# Patient Record
Sex: Male | Born: 1995
Health system: Southern US, Community
[De-identification: ages and names within clinical notes are randomized; demographics above are authoritative.]

## PROBLEM LIST (undated history)

## (undated) DIAGNOSIS — K219 Gastro-esophageal reflux disease without esophagitis: Secondary | ICD-10-CM

## (undated) HISTORY — DX: Gastro-esophageal reflux disease without esophagitis: K21.9

---
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
## 2015-02-01 ENCOUNTER — Ambulatory Visit (INDEPENDENT_AMBULATORY_CARE_PROVIDER_SITE_OTHER): Payer: 59 | Admitting: Emergency Medicine

## 2015-02-01 VITALS — BP 110/70 | HR 81 | Temp 98.6°F | Resp 16 | Ht 70.0 in | Wt 184.0 lb

## 2015-02-01 DIAGNOSIS — J0300 Acute streptococcal tonsillitis, unspecified: Secondary | ICD-10-CM

## 2015-02-01 DIAGNOSIS — J209 Acute bronchitis, unspecified: Secondary | ICD-10-CM | POA: Diagnosis not present

## 2015-02-01 DIAGNOSIS — J0140 Acute pansinusitis, unspecified: Secondary | ICD-10-CM

## 2015-02-01 MED ORDER — PSEUDOEPHEDRINE-GUAIFENESIN ER 60-600 MG PO TB12: 1.0000 | ORAL_TABLET | Freq: Two times a day (BID) | ORAL | Status: DC

## 2015-02-01 MED ORDER — AMOXICILLIN-POT CLAVULANATE 875-125 MG PO TABS: 1.0000 | ORAL_TABLET | Freq: Two times a day (BID) | ORAL | Status: DC

## 2015-02-01 MED ORDER — HYDROCODONE-ACETAMINOPHEN 5-325 MG PO TABS: 1.0000 | ORAL_TABLET | ORAL | Status: DC | PRN

## 2015-02-01 NOTE — Patient Instructions (Signed)

## 2015-02-01 NOTE — Progress Notes (Signed)
Subjective:  Patient ID: Gerald Dickson, male    DOB: 03-27-1995  Age: 19 y.o. MRN: [id number]  CC: Sore Throat; Fever; Nasal Congestion; and Generalized Body Aches   HPI Gerald Dickson presents  he has returned home from college the Thanksgiving holiday he was seen in an urgent care [facility] [place] for a sore throat told that he didn't "have strep" he has fever chills. He has fatigue and decreased appetite. Unable unable to swallow due to pain he has marked for throat with nasal discharge and nasal congestion and a purulent nasal drainage. He has postnasal drip and a nonproductive cough he has no nausea vomiting stool change rash or other complaint he has no improvement with over-the-counter medication  History Gerald Dickson has a past medical history of Asthma.   He has past surgical history that includes Capsulotomy (02/27/2011).   His  family history includes Cancer in his maternal grandfather; Heart attack in his other; Hypertension in his maternal grandmother. There is no history of Diabetes.  He   reports that he has never smoked. He does not have any smokeless tobacco history on file. He reports that he does not drink alcohol. His drug history is not on file.  Outpatient Prescriptions Prior to Visit  Medication Sig Dispense Refill  . ondansetron (ZOFRAN) 4 MG tablet Take 1 tablet (4 mg total) by mouth every 6 (six) hours. 12 tablet 0   No facility-administered medications prior to visit.    Social History   Social History  . Marital Status: Single    Spouse Name: N/A  . Number of Children: N/A  . Years of Education: N/A   Social History Main Topics  . Smoking status: Never Smoker   . Smokeless tobacco: None  . Alcohol Use: No  . Drug Use: None  . Sexual Activity: Not Asked   Other Topics Concern  . None   Social History Narrative     Review of Systems  Constitutional: Positive for fever, chills and fatigue. Negative for appetite change.  HENT: Positive  for congestion, postnasal drip, rhinorrhea, sinus pressure and sore throat. Negative for ear pain.   Eyes: Negative for pain and redness.  Respiratory: Positive for cough. Negative for shortness of breath and wheezing.   Cardiovascular: Negative for leg swelling.  Gastrointestinal: Negative for nausea, vomiting, abdominal pain, diarrhea, constipation and blood in stool.  Endocrine: Negative for polyuria.  Genitourinary: Negative for dysuria, urgency, frequency and flank pain.  Musculoskeletal: Negative for gait problem.  Skin: Negative for rash.  Neurological: Negative for weakness and headaches.  Psychiatric/Behavioral: Negative for confusion and decreased concentration. The patient is not nervous/anxious.     Objective:  BP 110/70 mmHg  Pulse 81  Temp(Src) 98.6 F (37 C) (Oral)  Resp 16  Ht 5\' 10"  (1.778 m)  Wt 184 lb (83.462 kg)  BMI 26.40 kg/m2  SpO2 94%  Physical Exam  Constitutional: He is oriented to person, place, and time. He appears well-developed and well-nourished. No distress.  HENT:  Head: Normocephalic and atraumatic.  Right Ear: External ear normal.  Left Ear: External ear normal.  Nose: Nose normal.  Mouth/Throat: Oropharyngeal exudate and posterior oropharyngeal erythema present.  Eyes: Conjunctivae and EOM are normal. Pupils are equal, round, and reactive to light. No scleral icterus.  Neck: Normal range of motion. Neck supple. No tracheal deviation present.  Cardiovascular: Normal rate, regular rhythm and normal heart sounds.   Pulmonary/Chest: Effort normal. No respiratory distress. He has no wheezes. He  has no rales.  Abdominal: He exhibits no mass. There is no tenderness. There is no rebound and no guarding.  Musculoskeletal: He exhibits no edema.  Lymphadenopathy:    He has no cervical adenopathy.  Neurological: He is alert and oriented to person, place, and time. Coordination normal.  Skin: Skin is warm and dry. No rash noted.  Psychiatric: He has a  normal mood and affect. His behavior is normal.   for the Thanksgiving holiday    Assessment & Plan:   [name] was seen today for sore throat, fever, nasal congestion and generalized body aches.  Diagnoses and all orders for this visit:  Acute non-recurrent streptococcal tonsillitis  Acute bronchitis, unspecified organism  Acute pansinusitis, recurrence not specified  Other orders -     amoxicillin-clavulanate (AUGMENTIN) 875-125 MG tablet; Take 1 tablet by mouth 2 (two) times daily. -     pseudoephedrine-guaifenesin (MUCINEX D) 60-600 MG 12 hr tablet; Take 1 tablet by mouth every 12 (twelve) hours. -     HYDROcodone-acetaminophen (NORCO) 5-325 MG tablet; Take 1-2 tablets by mouth every 4 (four) hours as needed.  I have discontinued [name]'s ondansetron. I am also having him start on amoxicillin-clavulanate, pseudoephedrine-guaifenesin, and HYDROcodone-acetaminophen. Additionally, I am having him maintain his amphetamine-dextroamphetamine.  Meds ordered this encounter  Medications  . amphetamine-dextroamphetamine (ADDERALL) 20 MG tablet    Sig: Take 20 mg by mouth daily.  Marland Kitchen amoxicillin-clavulanate (AUGMENTIN) 875-125 MG tablet    Sig: Take 1 tablet by mouth 2 (two) times daily.    Dispense:  20 tablet    Refill:  0  . pseudoephedrine-guaifenesin (MUCINEX D) 60-600 MG 12 hr tablet    Sig: Take 1 tablet by mouth every 12 (twelve) hours.    Dispense:  18 tablet    Refill:  0  . HYDROcodone-acetaminophen (NORCO) 5-325 MG tablet    Sig: Take 1-2 tablets by mouth every 4 (four) hours as needed.    Dispense:  30 tablet    Refill:  0    Appropriate red flag conditions were discussed with the patient as well as actions that should be taken.  Patient expressed his understanding.  Follow-up: Return if symptoms worsen or fail to improve.  Carmelina Dane, MD

## 2015-03-13 MED FILL — AMPHETAMINE SALTS 20 MG TAB: 20 | 30 days supply | Qty: 60 | Fill #0

## 2015-03-14 ENCOUNTER — Encounter: Payer: Self-pay | Admitting: Family Medicine

## 2015-03-14 ENCOUNTER — Ambulatory Visit (HOSPITAL_BASED_OUTPATIENT_CLINIC_OR_DEPARTMENT_OTHER)
Admission: RE | Admit: 2015-03-14 | Discharge: 2015-03-14 | Disposition: A | Discharge status: Home / Self Care | Payer: 59 | Source: Ambulatory Visit | Attending: Family Medicine | Admitting: Family Medicine

## 2015-03-14 ENCOUNTER — Ambulatory Visit (INDEPENDENT_AMBULATORY_CARE_PROVIDER_SITE_OTHER): Payer: 59 | Admitting: Family Medicine

## 2015-03-14 ENCOUNTER — Other Ambulatory Visit: Payer: Self-pay | Admitting: Family Medicine

## 2015-03-14 VITALS — BP 122/78 | HR 56 | Ht 70.0 in | Wt 190.0 lb

## 2015-03-14 DIAGNOSIS — S8992XA Unspecified injury of left lower leg, initial encounter: Secondary | ICD-10-CM

## 2015-03-14 DIAGNOSIS — M25562 Pain in left knee: Secondary | ICD-10-CM

## 2015-03-14 NOTE — Patient Instructions (Signed)
You have a left knee sprain. Your exam and x-rays are reassuring. Ice the knee 15 minutes at a time 3-4 times a day. Knee extensions, straight leg raises, knee curls, hamstring swings 3 sets of 10 once a day. Do with ankle weights or on the bowflex you have. Check in with the athletic trainers at school when you get there. Ibuprofen or aleve if needed for pain. Consider physical therapy, MRI if not improving as expected. Focus on the rehab for the next 3 weeks then return to all sports as tolerated.

## 2015-03-16 DIAGNOSIS — S8992XA Unspecified injury of left lower leg, initial encounter: Secondary | ICD-10-CM | POA: Insufficient documentation

## 2015-03-16 NOTE — Assessment & Plan Note (Signed)
exam very reassuring and no effusion.  Consistent with a mild sprain.  Independently reviewed radiographs and no evidence abnormalities.  Shown home rehab exercises to do daily.  NSAIDs, icing.  Consider PT, MRI if not improving.  Activities as tolerated.

## 2015-03-16 NOTE — Progress Notes (Signed)
PCP: Carmin RichmondLARK,WILLIAM D, MD  Subjective:   HPI: Patient is a 20 y.o. male here for left knee injury.  Patient reports on 12/17 he was jumping on a trampoline. When he came down one time the knee 'felt weird' and he stopped. Gets pain in knee especially after working out now. Pain level up to 7/10, sharp deep knee anteriorly at worst. No swelling or bruising. No prior injuries. No skin changes, fever, other complaints.  Past Medical History  Diagnosis Date  . Asthma     as child    Current Outpatient Prescriptions on File Prior to Visit  Medication Sig Dispense Refill  . amphetamine-dextroamphetamine (ADDERALL) 20 MG tablet Take 20 mg by mouth daily.    Marland Kitchen. HYDROcodone-acetaminophen (NORCO) 5-325 MG tablet Take 1-2 tablets by mouth every 4 (four) hours as needed. 30 tablet 0   No current facility-administered medications on file prior to visit.    Past Surgical History  Procedure Laterality Date  . Capsulotomy  02/27/2011    Procedure: CAPSULOTOMY;  Surgeon: Nicki ReaperGary R Kuzma, MD;  Location: [facility];  Service: Orthopedics;  Laterality: Left;  Left Capsulotomy Metacarpal Phalangeal Llittle Finger      No Known Allergies  Social History   Social History  . Marital Status: Single    Spouse Name: N/A  . Number of Children: N/A  . Years of Education: N/A   Occupational History  . Not on file.   Social History Main Topics  . Smoking status: Never Smoker   . Smokeless tobacco: Not on file  . Alcohol Use: No  . Drug Use: Not on file  . Sexual Activity: Not on file   Other Topics Concern  . Not on file   Social History Narrative    Family History  Problem Relation Age of Onset  . Hypertension Maternal Grandmother   . Diabetes Neg Hx   . Heart attack Other   . Cancer Maternal Grandfather     BP 122/78 mmHg  Pulse 56  Ht 5\' 10"  (1.778 m)  Wt 190 lb (86.183 kg)  BMI 27.26 kg/m2  Review of Systems: See HPI above.    Objective:  Physical  Exam:  Gen: NAD  Left knee: No gross deformity, ecchymoses, swelling. No TTP. FROM. Negative ant/post drawers. Negative valgus/varus testing. Negative lachmanns. Negative mcmurrays, apleys, patellar apprehension. NV intact distally.  Right knee: FROM without pain.    Assessment & Plan:  1. Left knee injury - exam very reassuring and no effusion.  Consistent with a mild sprain.  Independently reviewed radiographs and no evidence abnormalities.  Shown home rehab exercises to do daily.  NSAIDs, icing.  Consider PT, MRI if not improving.  Activities as tolerated.

## 2015-09-18 MED FILL — DEXTROAMP-AMPHETAMIN 20 MG: 20 | 30 days supply | Qty: 60 | Fill #0

## 2015-09-29 ENCOUNTER — Encounter: Payer: Self-pay | Admitting: Family Medicine

## 2015-09-29 ENCOUNTER — Ambulatory Visit (INDEPENDENT_AMBULATORY_CARE_PROVIDER_SITE_OTHER): Payer: 59 | Admitting: Family Medicine

## 2015-09-29 VITALS — BP 115/75 | HR 49 | Ht 70.0 in | Wt 180.0 lb

## 2015-09-29 DIAGNOSIS — S8991XA Unspecified injury of right lower leg, initial encounter: Secondary | ICD-10-CM | POA: Diagnosis not present

## 2015-09-29 NOTE — Patient Instructions (Signed)
Your exam and ultrasound are reassuring. You have a lateral insertional hamstring tendon strain. Compression sleeve with sports for 4 weeks (wear with walking too if it bothers you with this). Focus on the rehab daily (with trainer and home exercises) - start with curls, swings, lunges. Icing or heat, whichever feels better 15 minutes at a time 3-4 times a day. Aleve 2 tabs twice a day with food OR ibuprofen 600mg  three times a day with food if needed - can consider taking this regularly for 7-10 days then as needed. Nitroglycerin patches 1/4th patch and change daily - consider these but typically only if you've gone 6 weeks with the rehab, compression, anti-inflammatories and are not improving. Activities as tolerated. Call me with any questions or concerns.

## 2015-09-29 NOTE — Assessment & Plan Note (Signed)
Exam, ultrasound reassuring.  No effusion, ligamentous and meniscus testing negative and no joint line tenderness.  Consistent with lateral insertional hamstring tendon strain.  Compression sleeve, focus on rehabilitation (working with Event organiserathletic trainer).  Ice/heat, consider 7-10 days regular nsaids.  Activities as tolerated.  Consider nitro patches, MRI if not improving (unlikely).  Call us with any questions.  Otherwise f/u prn.

## 2015-09-29 NOTE — Progress Notes (Signed)
PCP: Carmin RichmondLARK,WILLIAM D, MD  Subjective:   HPI: Patient is a 20 y.o. male here for right knee injury.  Patient reports on 6/19 he was playing in the outfield (baseball). He was running to catch a ball, tripped over another player's foot and hyperextended his right knee. Immediate pain, localized numbness posterior knee laterally. No swelling of knee. Has improved since then but still feels pain in same area posterolaterally - soreness especially with squatting. Current pain 0/10 though. No prior injuries to this knee (trampoline accident of left knee - seen earlier this year, no problems now). No skin changes, numbness now.  Past Medical History  Diagnosis Date  . Asthma     as child    Current Outpatient Prescriptions on File Prior to Visit  Medication Sig Dispense Refill  . amphetamine-dextroamphetamine (ADDERALL) 20 MG tablet Take 20 mg by mouth daily.    Marland Kitchen. HYDROcodone-acetaminophen (NORCO) 5-325 MG tablet Take 1-2 tablets by mouth every 4 (four) hours as needed. 30 tablet 0   No current facility-administered medications on file prior to visit.    Past Surgical History  Procedure Laterality Date  . Capsulotomy  02/27/2011    Procedure: CAPSULOTOMY;  Surgeon: Nicki ReaperGary R Kuzma, MD;  Location: [facility];  Service: Orthopedics;  Laterality: Left;  Left Capsulotomy Metacarpal Phalangeal Llittle Finger      No Known Allergies  Social History   Social History  . Marital Status: Single    Spouse Name: N/A  . Number of Children: N/A  . Years of Education: N/A   Occupational History  . Not on file.   Social History Main Topics  . Smoking status: Never Smoker   . Smokeless tobacco: Not on file  . Alcohol Use: No  . Drug Use: Not on file  . Sexual Activity: Not on file   Other Topics Concern  . Not on file   Social History Narrative    Family History  Problem Relation Age of Onset  . Hypertension Maternal Grandmother   . Diabetes Neg Hx   . Heart  attack Other   . Cancer Maternal Grandfather     BP 115/75 mmHg  Pulse 49  Ht 5\' 10"  (1.778 m)  Wt 180 lb (81.647 kg)  BMI 25.83 kg/m2  Review of Systems: See HPI above.    Objective:  Physical Exam:  Gen: NAD, comfortable in exam room  Right knee: No gross deformity, ecchymoses, swelling. TTP lateral hamstring tendon at insertion on fibula, fibular head.  Negative tinels here.  No numbness with compression. FROM with mild pain resisted flexion at 90 degrees. Negative ant/post drawers. Negative valgus/varus testing. Negative lachmanns. Negative mcmurrays, apleys, patellar apprehension. NV intact distally.  Left knee: FROM without pain.  MSK u/s: no effusion.  Biceps femoris appears intact without tears, neovascularity, edema.  Fibular head similarly unremarkable.    Assessment & Plan:  1. Right knee injury - Exam, ultrasound reassuring.  No effusion, ligamentous and meniscus testing negative and no joint line tenderness.  Consistent with lateral insertional hamstring tendon strain.  Compression sleeve, focus on rehabilitation (working with Event organiserathletic trainer).  Ice/heat, consider 7-10 days regular nsaids.  Activities as tolerated.  Consider nitro patches, MRI if not improving (unlikely).  Call us with any questions.  Otherwise f/u prn.

## 2015-10-19 MED FILL — DEXTROAMP-AMPHETAMIN 20 MG: 20 | 30 days supply | Qty: 60 | Fill #0

## 2015-10-30 DIAGNOSIS — M25551 Pain in right hip: Secondary | ICD-10-CM | POA: Diagnosis not present

## 2015-10-30 DIAGNOSIS — S76301A Unspecified injury of muscle, fascia and tendon of the posterior muscle group at thigh level, right thigh, initial encounter: Secondary | ICD-10-CM | POA: Insufficient documentation

## 2015-10-30 DIAGNOSIS — M25559 Pain in unspecified hip: Secondary | ICD-10-CM | POA: Insufficient documentation

## 2015-11-03 DIAGNOSIS — S76301A Unspecified injury of muscle, fascia and tendon of the posterior muscle group at thigh level, right thigh, initial encounter: Secondary | ICD-10-CM | POA: Diagnosis not present

## 2015-11-07 DIAGNOSIS — S76301A Unspecified injury of muscle, fascia and tendon of the posterior muscle group at thigh level, right thigh, initial encounter: Secondary | ICD-10-CM | POA: Diagnosis not present

## 2015-11-07 DIAGNOSIS — M25551 Pain in right hip: Secondary | ICD-10-CM | POA: Diagnosis not present

## 2015-11-09 DIAGNOSIS — R6889 Other general symptoms and signs: Secondary | ICD-10-CM | POA: Diagnosis not present

## 2016-01-31 DIAGNOSIS — H52223 Regular astigmatism, bilateral: Secondary | ICD-10-CM | POA: Diagnosis not present

## 2016-01-31 DIAGNOSIS — H5213 Myopia, bilateral: Secondary | ICD-10-CM | POA: Diagnosis not present

## 2016-08-15 MED FILL — AMPHETAMINE SALTS 30 MG TAB: 30 | 30 days supply | Qty: 60 | Fill #0

## 2016-12-03 MED FILL — AMPHETAMINE SALTS 30 MG TAB: 30 | 30 days supply | Qty: 60 | Fill #0

## 2016-12-31 MED FILL — DEXTROAMP-AMPHETAMIN 30 MG: 30 | 30 days supply | Qty: 60 | Fill #0

## 2017-03-14 ENCOUNTER — Other Ambulatory Visit: Payer: Self-pay

## 2017-03-14 ENCOUNTER — Ambulatory Visit (INDEPENDENT_AMBULATORY_CARE_PROVIDER_SITE_OTHER): Payer: Self-pay | Admitting: Emergency Medicine

## 2017-03-14 ENCOUNTER — Ambulatory Visit (HOSPITAL_COMMUNITY)
Admission: EM | Admit: 2017-03-14 | Discharge: 2017-03-14 | Disposition: A | Discharge status: Home / Self Care | Payer: 59 | Attending: Family Medicine | Admitting: Family Medicine

## 2017-03-14 ENCOUNTER — Encounter: Payer: Self-pay | Admitting: Emergency Medicine

## 2017-03-14 ENCOUNTER — Encounter (HOSPITAL_COMMUNITY): Payer: Self-pay | Admitting: Emergency Medicine

## 2017-03-14 VITALS — BP 120/78 | HR 65 | Wt 208.6 lb

## 2017-03-14 DIAGNOSIS — R109 Unspecified abdominal pain: Secondary | ICD-10-CM | POA: Diagnosis not present

## 2017-03-14 DIAGNOSIS — M20029 Boutonniere deformity of unspecified finger(s): Secondary | ICD-10-CM | POA: Diagnosis not present

## 2017-03-14 DIAGNOSIS — Z113 Encounter for screening for infections with a predominantly sexual mode of transmission: Secondary | ICD-10-CM | POA: Diagnosis not present

## 2017-03-14 DIAGNOSIS — Z79891 Long term (current) use of opiate analgesic: Secondary | ICD-10-CM | POA: Insufficient documentation

## 2017-03-14 DIAGNOSIS — R5383 Other fatigue: Secondary | ICD-10-CM | POA: Diagnosis not present

## 2017-03-14 DIAGNOSIS — Z809 Family history of malignant neoplasm, unspecified: Secondary | ICD-10-CM | POA: Insufficient documentation

## 2017-03-14 DIAGNOSIS — Z79899 Other long term (current) drug therapy: Secondary | ICD-10-CM | POA: Diagnosis not present

## 2017-03-14 DIAGNOSIS — J45909 Unspecified asthma, uncomplicated: Secondary | ICD-10-CM | POA: Diagnosis not present

## 2017-03-14 DIAGNOSIS — R300 Dysuria: Secondary | ICD-10-CM

## 2017-03-14 DIAGNOSIS — Z833 Family history of diabetes mellitus: Secondary | ICD-10-CM | POA: Diagnosis not present

## 2017-03-14 DIAGNOSIS — R369 Urethral discharge, unspecified: Secondary | ICD-10-CM

## 2017-03-14 DIAGNOSIS — Z8249 Family history of ischemic heart disease and other diseases of the circulatory system: Secondary | ICD-10-CM | POA: Diagnosis not present

## 2017-03-14 DIAGNOSIS — Z9889 Other specified postprocedural states: Secondary | ICD-10-CM | POA: Diagnosis not present

## 2017-03-14 LAB — POCT URINALYSIS DIP (DEVICE)
BILIRUBIN URINE: NEGATIVE
GLUCOSE, UA: NEGATIVE mg/dL
KETONES UR: NEGATIVE mg/dL
NITRITE: NEGATIVE
PH: 7 (ref 5.0–8.0)
Protein, ur: NEGATIVE mg/dL
Specific Gravity, Urine: 1.02 (ref 1.005–1.030)
Urobilinogen, UA: 0.2 mg/dL (ref 0.0–1.0)

## 2017-03-14 LAB — POC URINALSYSI DIPSTICK (AUTOMATED)
Bilirubin, UA: NEGATIVE
Glucose, UA: NEGATIVE
Ketones, UA: NEGATIVE
NITRITE UA: NEGATIVE
PH UA: 6 (ref 5.0–8.0)
Spec Grav, UA: 1.025 (ref 1.010–1.025)
UROBILINOGEN UA: NEGATIVE U/dL — AB

## 2017-03-14 MED ORDER — CEFTRIAXONE SODIUM 250 MG IJ SOLR
250.0000 mg | Freq: Once | INTRAMUSCULAR | Status: AC
  Administered 2017-03-14: 250 mg via INTRAMUSCULAR

## 2017-03-14 MED ORDER — AZITHROMYCIN 250 MG PO TABS
ORAL_TABLET | ORAL | Status: AC
  Filled 2017-03-14: qty 4

## 2017-03-14 MED ORDER — CEFTRIAXONE SODIUM 250 MG IJ SOLR
INTRAMUSCULAR | Status: AC
  Filled 2017-03-14: qty 250

## 2017-03-14 MED ORDER — AZITHROMYCIN 250 MG PO TABS
1000.0000 mg | ORAL_TABLET | Freq: Once | ORAL | Status: AC
  Administered 2017-03-14: 1000 mg via ORAL

## 2017-03-14 NOTE — Patient Instructions (Signed)
I recommend following up with the Health Department, the [facility] has a free clinic on thursdays, call for an appointment, or go to the [facility] now for STD testing and rule out.

## 2017-03-14 NOTE — ED Provider Notes (Signed)
[facility]    CSN: [id number] Arrival date & time: 03/14/17  1130     History   Chief Complaint Chief Complaint  Patient presents with  . SEXUALLY TRANSMITTED DISEASE    HPI Gerald Dickson is a 22 y.o. male presenting with penile discharge for 2 weeks. States he is sexually active, no history of previous STI. Has felt more easily fatigued and mild abdominal pain also. Denies fever, nausea, vomiting, rash, and scrotal/testicular pain. Denies rashes/lesions/bumps to genital area.  HPI  Past Medical History:  Diagnosis Date  . Asthma    as child    Patient Active Problem List   Diagnosis Date Noted  . Right knee injury 09/29/2015  . Left knee injury 03/16/2015  . 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 Right shoulder pain 12/07/2010  . HIP PAIN, RIGHT 05/14/2010    Past Surgical History:  Procedure Laterality Date  . CAPSULOTOMY  02/27/2011   Procedure: CAPSULOTOMY;  Surgeon: Nicki ReaperGary R Kuzma, MD;  Location: [facility];  Service: Orthopedics;  Laterality: Left;  Left Capsulotomy Metacarpal Phalangeal Llittle Finger         Home Medications    Prior to Admission medications   Medication Sig Start Date End Date Taking? Authorizing Provider  amphetamine-dextroamphetamine (ADDERALL) 20 MG tablet Take 30 mg by mouth daily.    Yes [provider]  HYDROcodone-acetaminophen (NORCO) 5-325 MG tablet Take 1-2 tablets by mouth every 4 (four) hours as needed. 02/01/15   Carmelina DaneAnderson, Jeffery S, MD    Family History Family History  Problem Relation Age of Onset  . Hypertension Maternal Grandmother   . Diabetes Neg Hx   . Heart attack Other   . Cancer Maternal Grandfather     Social History Social History   Tobacco Use  . Smoking status: Never Smoker  . Smokeless tobacco: Never Used  Substance Use Topics  . Alcohol use: No    Alcohol/week: 0.0 oz  . Drug use: Not on file     Allergies   Patient has no known  allergies.   Review of Systems Review of Systems  Constitutional: Positive for fatigue. Negative for fever.  Gastrointestinal: Positive for abdominal pain. Negative for nausea and vomiting.  Genitourinary: Positive for discharge. Negative for dysuria, hematuria, penile pain, scrotal swelling and testicular pain.  Neurological: Negative for dizziness, light-headedness and headaches.     Physical Exam Triage Vital Signs ED Triage Vitals [03/14/17 1256]  Enc Vitals Group     BP 118/70     Pulse Rate 63     Resp      Temp 98.5 F (36.9 C)     Temp Source Oral     SpO2 100 %     Weight      Height      Head Circumference      Peak Flow      Pain Score      Pain Loc      Pain Edu?      Excl. in GC?    No data found.  Updated Vital Signs BP 118/70 (BP Location: Left Arm)   Pulse 63   Temp 98.5 F (36.9 C) (Oral)   SpO2 100%    Physical Exam  Constitutional: He appears well-developed and well-nourished.  HENT:  Head: Normocephalic and atraumatic.  Eyes: Conjunctivae are normal.  Neck: Neck supple.  Cardiovascular: Normal rate and regular rhythm.  No murmur heard. Pulmonary/Chest: Effort normal and breath  sounds normal. No respiratory distress.  Abdominal: Soft. There is no tenderness.  Non-tender to palpation throughout all 4 quadrants  Musculoskeletal: He exhibits no edema.  Neurological: He is alert.  Skin: Skin is warm and dry.  Psychiatric: He has a normal mood and affect.  Nursing note and vitals reviewed.    UC Treatments / Results  Labs (all labs ordered are listed, but only abnormal results are displayed) Labs Reviewed  POCT URINALYSIS DIP (DEVICE) - Abnormal; Notable for the following components:      Result Value   Hgb urine dipstick TRACE (*)    Leukocytes, UA SMALL (*)    All other components within normal limits  URINE CYTOLOGY ANCILLARY ONLY    EKG  EKG Interpretation None       Radiology No results  found.  Procedures Procedures (including critical care time)  Medications Ordered in UC Medications  cefTRIAXone (ROCEPHIN) injection 250 mg (250 mg Intramuscular Given 03/14/17 1334)  azithromycin (ZITHROMAX) tablet 1,000 mg (1,000 mg Oral Given 03/14/17 1334)     Initial Impression / Assessment and Plan / UC Course  I have reviewed the triage vital signs and the nursing notes.  Pertinent labs & imaging results that were available during my care of the patient were reviewed by me and considered in my medical decision making (see chart for details).     Patient with penile discharge and small leuks on UA.Marland Kitchen Screening for STD's performed, will call to inform of positive results. Patient requesting empiric treatment today, given ceftriaxone and azithromycin.   Discussed strict return precautions. Patient verbalized understanding and is agreeable with plan.    Final Clinical Impressions(s) / UC Diagnoses   Final diagnoses:  Penile discharge    ED Discharge Orders    None       Controlled Substance Prescriptions [place] Controlled Substance Registry consulted? Not Applicable   [name], [place] 03/14/17 2259

## 2017-03-14 NOTE — ED Triage Notes (Signed)
Pt reports lower abdominal pain, pain with urination, and penile discharge for 2.5 weeks.

## 2017-03-14 NOTE — Progress Notes (Signed)
Subjective:    Gerald Dickson is a 22 y.o. male who complains of burning with urination, frequency, incomplete bladder emptying, suprapubic pressure, urgency and penile discharge for 2 days.  Patient also complains of back pain. Patient denies fever, headache and stomach ache.  Patient does not have a history of recurrent UTI.  Patient does not have a history of pyelonephritis. He is sexually active, does not wear protection The following portions of the patient's history were reviewed and updated as appropriate: allergies and current medications. Review of Systems Pertinent items are noted in HPI.    Objective:    BP 120/78   Pulse 65   Wt 208 lb 9.6 oz (94.6 kg)   SpO2 99%   BMI 29.93 kg/m  General: alert, cooperative and appears stated age  Abdomen: soft, non-tender, without masses or organomegaly in the lower abdomen  Back: CVA tenderness absent  GU: penis exam: non focal circumcised and discharge no axial lymphadenopathy   Laboratory:  Urine dipstick shows 2+ for leukocyte esterase.   Micro exam: not done.    Assessment:    Dysuria    Plan: Plan:   Based on signs, symptoms, and exam, unable to rule out STD, recommend follow up with Gerald Dickson, or Gerald Dickson for testing and treatment

## 2017-03-14 NOTE — Discharge Instructions (Signed)
We have treated you today for gonorrhea and chlamydia, with ceftriaxone and azithromycin. Please refrain from sexual activity for 7 days while medicine is clearing infection.  We are testing you for Gonorrhea, Chlamydia and Trichomonas. We will call you if anything is positive and let you know if you require any further treatment. Please inform partner of any positive results.  Please return if symptoms not improving with treatment, development of fever, nausea, vomiting, abdominal pain, scrotal pain.

## 2017-03-16 ENCOUNTER — Telehealth: Payer: Self-pay

## 2017-03-16 NOTE — Telephone Encounter (Signed)
Courtesy Call - unable to leave vm - recording asking for a mailbox number.

## 2017-03-17 LAB — URINE CYTOLOGY ANCILLARY ONLY
CHLAMYDIA, DNA PROBE: POSITIVE — AB
Neisseria Gonorrhea: NEGATIVE
Trichomonas: NEGATIVE

## 2017-04-16 DIAGNOSIS — Z79899 Other long term (current) drug therapy: Secondary | ICD-10-CM | POA: Diagnosis not present

## 2017-04-16 DIAGNOSIS — F419 Anxiety disorder, unspecified: Secondary | ICD-10-CM | POA: Diagnosis not present

## 2017-04-16 DIAGNOSIS — F902 Attention-deficit hyperactivity disorder, combined type: Secondary | ICD-10-CM | POA: Diagnosis not present

## 2017-04-16 DIAGNOSIS — F429 Obsessive-compulsive disorder, unspecified: Secondary | ICD-10-CM | POA: Diagnosis not present

## 2017-04-16 DIAGNOSIS — F338 Other recurrent depressive disorders: Secondary | ICD-10-CM | POA: Diagnosis not present

## 2017-04-16 DIAGNOSIS — R41840 Attention and concentration deficit: Secondary | ICD-10-CM | POA: Diagnosis not present

## 2017-04-16 DIAGNOSIS — F4010 Social phobia, unspecified: Secondary | ICD-10-CM | POA: Diagnosis not present

## 2017-04-18 MED FILL — AMPHETAMINE-DEXTROAMPHETAMI: 30 | 30 days supply | Qty: 60 | Fill #0

## 2017-05-16 MED FILL — AMPHETAMINE-DEXTROAMPHETAMI: 30 | 30 days supply | Qty: 60 | Fill #0

## 2017-06-16 MED FILL — AMPHETAMINE SALTS 30 MG TAB: 30 | 30 days supply | Qty: 60 | Fill #0

## 2017-07-17 MED FILL — AMPHETAMINE SALTS 30 MG TAB: 30 | 30 days supply | Qty: 60 | Fill #0

## 2017-08-18 MED FILL — AMPHETAMINE SALTS 30 MG TAB: 30 | 30 days supply | Qty: 60 | Fill #0

## 2017-09-16 MED FILL — AMPHETAMINE SALTS 30 MG TAB: 30 | 30 days supply | Qty: 60 | Fill #0

## 2017-10-16 MED FILL — AMPHETAMINE SALTS 30 MG TAB: 30 | 30 days supply | Qty: 60 | Fill #0

## 2017-11-19 MED FILL — DEXTROAMPH TB 30MG NSTR 100: 30 | 30 days supply | Qty: 60 | Fill #0

## 2017-11-26 DIAGNOSIS — F902 Attention-deficit hyperactivity disorder, combined type: Secondary | ICD-10-CM | POA: Diagnosis not present

## 2017-11-26 DIAGNOSIS — Z79899 Other long term (current) drug therapy: Secondary | ICD-10-CM | POA: Diagnosis not present

## 2017-12-17 MED FILL — AMPHETAMINE SALTS 30 MG TAB: 30 | 30 days supply | Qty: 60 | Fill #0

## 2018-01-19 MED FILL — AMPHETAMINE SALTS 30 MG TAB: 30 | 30 days supply | Qty: 60 | Fill #0

## 2018-02-03 DIAGNOSIS — M9902 Segmental and somatic dysfunction of thoracic region: Secondary | ICD-10-CM | POA: Diagnosis not present

## 2018-02-03 DIAGNOSIS — M9903 Segmental and somatic dysfunction of lumbar region: Secondary | ICD-10-CM | POA: Diagnosis not present

## 2018-02-03 DIAGNOSIS — M545 Low back pain: Secondary | ICD-10-CM | POA: Diagnosis not present

## 2018-02-03 DIAGNOSIS — M5386 Other specified dorsopathies, lumbar region: Secondary | ICD-10-CM | POA: Diagnosis not present

## 2018-02-03 DIAGNOSIS — M9905 Segmental and somatic dysfunction of pelvic region: Secondary | ICD-10-CM | POA: Diagnosis not present

## 2018-02-03 DIAGNOSIS — M7918 Myalgia, other site: Secondary | ICD-10-CM | POA: Diagnosis not present

## 2018-02-03 DIAGNOSIS — M62830 Muscle spasm of back: Secondary | ICD-10-CM | POA: Diagnosis not present

## 2018-02-19 MED FILL — AMPHETAMINE SALTS 30 MG TAB: 30 | 30 days supply | Qty: 60 | Fill #0

## 2018-03-16 ENCOUNTER — Ambulatory Visit (INDEPENDENT_AMBULATORY_CARE_PROVIDER_SITE_OTHER): Payer: Self-pay | Admitting: Nurse Practitioner

## 2018-03-16 VITALS — BP 100/72 | Temp 98.4°F | Wt 200.0 lb

## 2018-03-16 DIAGNOSIS — H6592 Unspecified nonsuppurative otitis media, left ear: Secondary | ICD-10-CM

## 2018-03-16 DIAGNOSIS — H6981 Other specified disorders of Eustachian tube, right ear: Secondary | ICD-10-CM

## 2018-03-16 MED ORDER — CETIRIZINE HCL 10 MG PO TABS: 10.0000 mg | ORAL_TABLET | Freq: Every day | ORAL | 0 refills | Status: DC

## 2018-03-16 MED ORDER — AMOXICILLIN 875 MG PO TABS: 875.0000 mg | ORAL_TABLET | Freq: Two times a day (BID) | ORAL | 0 refills | Status: AC

## 2018-03-16 MED ORDER — FLUTICASONE PROPIONATE 50 MCG/ACT NA SUSP: 2.0000 | Freq: Every day | NASAL | 0 refills | Status: DC

## 2018-03-16 NOTE — Progress Notes (Signed)
Subjective:     Gerald BarefootDarryl Falwell is a 23 y.o. male who presents with ear pain and possible ear infection. Symptoms include: bilateral ear pain and plugged sensation in both ears. Onset of symptoms was 2 days ago, and have been gradually worsening since that time. Associated symptoms include: chills, headache and nasal congestion.  Patient denies: post nasal drip, productive cough, sinus pressure, sneezing and sore throat. He is drinking plenty of fluids.  The patient states he was [place], and noticed this is when his symptoms worsened.  Patient states over the last day the pain has worsened.  Patient rates pain 3/10 at present in both ears at this time.  Patient has been taking DayQuil and NyQuil for his symptoms.  The following portions of the patient's history were reviewed and updated as appropriate: allergies, current medications and past medical history.  Review of Systems Constitutional: positive for chills, negative for anorexia, fatigue, fevers and malaise Eyes: negative Ears, nose, mouth, throat, and face: positive for earaches and nasal congestion, negative for ear drainage and sore throat Respiratory: negative Cardiovascular: negative Gastrointestinal: negative Neurological: positive for headaches, negative for coordination problems, dizziness, gait problems and paresthesia   Objective:    BP 100/72 (BP Location: Right Arm, Patient Position: Sitting)   Temp 98.4 F (36.9 C) (Oral)   Wt 200 lb (90.7 kg)   SpO2 100%   BMI 28.70 kg/m  General:  alert, cooperative and no distress  Right Ear: Mucoid middle ear fluid present, no erythema, canal is normal  Left Ear: Purulent middle ear fluid present in middle ear, canal is normal  Mouth:  lips, mucosa, and tongue normal; teeth and gums normal  Neck: no adenopathy, no carotid bruit, no JVD, supple, symmetrical, trachea midline and thyroid not enlarged, symmetric, no tenderness/mass/nodules     Assessment:     Left acute suppurative otitis media  Right Eustachian Tube Dysfunction Plan:   Exam findings, diagnosis etiology and medication use and indications reviewed with patient. Follow- Up and discharge instructions provided. No emergent/urgent issues found on exam.  Will treat for suppurative otitis media for the left ear.  Patient has presence of pus appearing middle ear fluid.  Will treat for eustachian tube dysfunction using Flonase and cetirizine to help with the bilateral middle ear effusions.  Patient education was provided. Patient verbalized understanding of information provided and agrees with plan of care (POC), all questions answered. The patient is advised to call or return to clinic if condition does not see an improvement in symptoms, or to seek the care of the closest emergency department if condition worsens with the above plan.   1. Left otitis media with effusion  - amoxicillin (AMOXIL) 875 MG tablet; Take 1 tablet (875 mg total) by mouth 2 (two) times daily for 7 days.  Dispense: 14 tablet; Refill: 0 -Take medication as prescribed. -Ibuprofen or Tylenol for pain, fever, or general discomfort. -Warm compresses to both ears to help with pain or discomfort. -Increase fluids. -Get plenty of rest. -Follow-up as needed.  2. Acute dysfunction of right eustachian tube  - fluticasone (FLONASE) 50 MCG/ACT nasal spray; Place 2 sprays into both nostrils daily for 10 days.  Dispense: 16 g; Refill: 0 - cetirizine (ZYRTEC) 10 MG tablet; Take 1 tablet (10 mg total) by mouth daily.  Dispense: 30 tablet; Refill: 0 -Take medication as prescribed. -Ibuprofen or Tylenol for pain, fever, or general discomfort. -Warm compresses to both ears to help with pain or  discomfort. -Increase fluids. -Get plenty of rest. -Follow-up as needed.

## 2018-03-16 NOTE — Patient Instructions (Addendum)
Otitis Media, Adult -Take medication as prescribed. -Ibuprofen or Tylenol for pain, fever, or general discomfort. -Warm compresses to both ears to help with pain or discomfort. -Increase fluids. -Get plenty of rest. -Follow-up as needed.  Otitis media occurs when there is inflammation and fluid in the middle ear. Your middle ear is a part of the ear that contains bones for hearing as well as air that helps send sounds to your brain. What are the causes? This condition is caused by a blockage in the eustachian tube. This tube drains fluid from the ear to the back of the nose (nasopharynx). A blockage in this tube can be caused by an object or by swelling (edema) in the tube. Problems that can cause a blockage include:  A cold or other upper respiratory infection.  Allergies.  An irritant, such as tobacco smoke.  Enlarged adenoids. The adenoids are areas of soft tissue located high in the back of the throat, behind the nose and the roof of the mouth.  A mass in the nasopharynx.  Damage to the ear caused by pressure changes (barotrauma). What are the signs or symptoms? Symptoms of this condition include:  Ear pain.  A fever.  Decreased hearing.  A headache.  Tiredness (lethargy).  Fluid leaking from the ear.  Ringing in the ear. How is this diagnosed? This condition is diagnosed with a physical exam. During the exam your health care provider will use an instrument called an otoscope to look into your ear and check for redness, swelling, and fluid. He or she will also ask about your symptoms. Your health care provider may also order tests, such as:  A test to check the movement of the eardrum (pneumatic otoscopy). This test is done by squeezing a small amount of air into the ear.  A test that changes air pressure in the middle ear to check how well the eardrum moves and whether the eustachian tube is working (tympanogram). How is this treated? This condition usually goes  away on its own within 3-5 days. But if the condition is caused by a bacteria infection and does not go away own its own, or keeps coming back, your health care provider may:  Prescribe antibiotic medicines to treat the infection.  Prescribe or recommend medicines to control pain. Follow these instructions at home:  Take over-the-counter and prescription medicines only as told by your health care provider.  If you were prescribed an antibiotic medicine, take it as told by your health care provider. Do not stop taking the antibiotic even if you start to feel better.  Keep all follow-up visits as told by your health care provider. This is important. Contact a health care provider if:  You have bleeding from your nose.  There is a lump on your neck.  You are not getting better in 5 days.  You feel worse instead of better. Get help right away if:  You have severe pain that is not controlled with medicine.  You have swelling, redness, or pain around your ear.  You have stiffness in your neck.  A part of your face is paralyzed.  The bone behind your ear (mastoid) is tender when you touch it.  You develop a severe headache. Summary  Otitis media is redness, soreness, and swelling of the middle ear.  This condition usually goes away on its own within 3-5 days.  If the problem does not go away in 3-5 days, your health care provider may prescribe or recommend medicines  to treat your symptoms.  If you were prescribed an antibiotic medicine, take it as told by your health care provider. This information is not intended to replace advice given to you by your health care provider. Make sure you discuss any questions you have with your health care provider. Document Released: 12/01/2003 Document Revised: 02/16/2016 Document Reviewed: 02/16/2016 Elsevier Interactive Patient Education  2019 [employer] Inc.  Eustachian Tube Dysfunction  Eustachian tube dysfunction refers to a condition  in which a blockage develops in the narrow passage that connects the middle ear to the back of the nose (eustachian tube). The eustachian tube regulates air pressure in the middle ear by letting air move between the ear and nose. It also helps to drain fluid from the middle ear space. Eustachian tube dysfunction can affect one or both ears. When the eustachian tube does not function properly, air pressure, fluid, or both can build up in the middle ear. What are the causes? This condition occurs when the eustachian tube becomes blocked or cannot open normally. Common causes of this condition include:  Ear infections.  Colds and other infections that affect the nose, mouth, and throat (upper respiratory tract).  Allergies.  Irritation from cigarette smoke.  Irritation from stomach acid coming up into the esophagus (gastroesophageal reflux). The esophagus is the tube that carries food from the mouth to the stomach.  Sudden changes in air pressure, such as from descending in an airplane or scuba diving.  Abnormal growths in the nose or throat, such as: ? Growths that line the nose (nasal polyps). ? Abnormal growth of cells (tumors). ? Enlarged tissue at the back of the throat (adenoids). What increases the risk? You are more likely to develop this condition if:  You smoke.  You are overweight.  You are a child who has: ? Certain birth defects of the mouth, such as cleft palate. ? Large tonsils or adenoids. What are the signs or symptoms? Common symptoms of this condition include:  A feeling of fullness in the ear.  Ear pain.  Clicking or popping noises in the ear.  Ringing in the ear.  Hearing loss.  Loss of balance.  Dizziness. Symptoms may get worse when the air pressure around you changes, such as when you travel to an area of high elevation, fly on an airplane, or go scuba diving. How is this diagnosed? This condition may be diagnosed based on:  Your symptoms.  A  physical exam of your ears, nose, and throat.  Tests, such as those that measure: ? The movement of your eardrum (tympanogram). ? Your hearing (audiometry). How is this treated? Treatment depends on the cause and severity of your condition.  In mild cases, you may relieve your symptoms by moving air into your ears. This is called "popping the ears."  In more severe cases, or if you have symptoms of fluid in your ears, treatment may include: ? Medicines to relieve congestion (decongestants). ? Medicines that treat allergies (antihistamines). ? Nasal sprays or ear drops that contain medicines that reduce swelling (steroids). ? A procedure to drain the fluid in your eardrum (myringotomy). In this procedure, a small tube is placed in the eardrum to:  Drain the fluid.  Restore the air in the middle ear space. ? A procedure to insert a balloon device through the nose to inflate the opening of the eustachian tube (balloon dilation). Follow these instructions at home: Lifestyle  Do not do any of the following until your health care provider  approves: ? Travel to high altitudes. ? Fly in airplanes. ? Work in a Estate agentpressurized cabin or room. ? Scuba dive.  Do not use any products that contain nicotine or tobacco, such as cigarettes and e-cigarettes. If you need help quitting, ask your health care provider.  Keep your ears dry. Wear fitted earplugs during showering and bathing. Dry your ears completely after. General instructions  Take over-the-counter and prescription medicines only as told by your health care provider.  Use techniques to help pop your ears as recommended by your health care provider. These may include: ? Chewing gum. ? Yawning. ? Frequent, forceful swallowing. ? Closing your mouth, holding your nose closed, and gently blowing as if you are trying to blow air out of your nose.  Keep all follow-up visits as told by your health care provider. This is important. Contact a  health care provider if:  Your symptoms do not go away after treatment.  Your symptoms come back after treatment.  You are unable to pop your ears.  You have: ? A fever. ? Pain in your ear. ? Pain in your head or neck. ? Fluid draining from your ear.  Your hearing suddenly changes.  You become very dizzy.  You lose your balance. Summary  Eustachian tube dysfunction refers to a condition in which a blockage develops in the eustachian tube.  It can be caused by ear infections, allergies, inhaled irritants, or abnormal growths in the nose or throat.  Symptoms include ear pain, hearing loss, or ringing in the ears.  Mild cases are treated with maneuvers to unblock the ears, such as yawning or ear popping.  Severe cases are treated with medicines. Surgery may also be done (rare). This information is not intended to replace advice given to you by your health care provider. Make sure you discuss any questions you have with your health care provider. Document Released: 03/24/2015 Document Revised: 06/17/2017 Document Reviewed: 06/17/2017 Elsevier Interactive Patient Education  2019 [employer]

## 2018-03-17 DIAGNOSIS — F902 Attention-deficit hyperactivity disorder, combined type: Secondary | ICD-10-CM | POA: Diagnosis not present

## 2018-03-17 DIAGNOSIS — Z79899 Other long term (current) drug therapy: Secondary | ICD-10-CM | POA: Diagnosis not present

## 2018-03-19 MED FILL — AMPHETAMINE SALTS 30 MG TAB: 30 | 30 days supply | Qty: 60 | Fill #0

## 2018-03-20 DIAGNOSIS — M7918 Myalgia, other site: Secondary | ICD-10-CM | POA: Diagnosis not present

## 2018-03-20 DIAGNOSIS — M9902 Segmental and somatic dysfunction of thoracic region: Secondary | ICD-10-CM | POA: Diagnosis not present

## 2018-03-20 DIAGNOSIS — M62830 Muscle spasm of back: Secondary | ICD-10-CM | POA: Diagnosis not present

## 2018-03-20 DIAGNOSIS — M545 Low back pain: Secondary | ICD-10-CM | POA: Diagnosis not present

## 2018-03-20 DIAGNOSIS — M5386 Other specified dorsopathies, lumbar region: Secondary | ICD-10-CM | POA: Diagnosis not present

## 2018-03-20 DIAGNOSIS — M9903 Segmental and somatic dysfunction of lumbar region: Secondary | ICD-10-CM | POA: Diagnosis not present

## 2018-03-20 DIAGNOSIS — M9905 Segmental and somatic dysfunction of pelvic region: Secondary | ICD-10-CM | POA: Diagnosis not present

## 2018-03-25 DIAGNOSIS — M545 Low back pain: Secondary | ICD-10-CM | POA: Diagnosis not present

## 2018-03-25 DIAGNOSIS — M79606 Pain in leg, unspecified: Secondary | ICD-10-CM | POA: Diagnosis not present

## 2018-03-26 ENCOUNTER — Ambulatory Visit (INDEPENDENT_AMBULATORY_CARE_PROVIDER_SITE_OTHER): Payer: 59 | Admitting: Family Medicine

## 2018-03-26 ENCOUNTER — Other Ambulatory Visit: Payer: 59

## 2018-03-26 ENCOUNTER — Ambulatory Visit (INDEPENDENT_AMBULATORY_CARE_PROVIDER_SITE_OTHER): Payer: 59

## 2018-03-26 ENCOUNTER — Encounter (INDEPENDENT_AMBULATORY_CARE_PROVIDER_SITE_OTHER): Payer: Self-pay | Admitting: Family Medicine

## 2018-03-26 DIAGNOSIS — M545 Low back pain, unspecified: Secondary | ICD-10-CM

## 2018-03-26 MED ORDER — TIZANIDINE HCL 2 MG PO TABS: 2.0000 mg | ORAL_TABLET | Freq: Four times a day (QID) | ORAL | 1 refills | Status: DC | PRN

## 2018-03-26 MED ORDER — ETODOLAC 400 MG PO TABS: 400.0000 mg | ORAL_TABLET | Freq: Two times a day (BID) | ORAL | 3 refills | Status: DC | PRN

## 2018-03-26 MED FILL — ETODOLAC 400 MG TABLET: 400 | 30 days supply | Qty: 60 | Fill #0

## 2018-03-26 MED FILL — tiZANidine HCL 2 MG TABS: 2 | 8 days supply | Qty: 60 | Fill #0

## 2018-03-26 NOTE — Progress Notes (Unsigned)
Patient's mom came into office today and requested notes to send to [facility].  Patient is joining their organization and notes are needed to continue care for him.  I called patient and he gives verbal authorization for [place] to release notes from today's visit to his mom.

## 2018-03-26 NOTE — Progress Notes (Signed)
Office Visit Note   Patient: Gerald Dickson           Date of Birth: 1995-08-01           MRN: [id number] Visit Date: 03/26/2018 Requested by: Eliberto Ivory, MD [address] [facility], [place]. [place], [place] [address] PCP: Eliberto Ivory, MD  Subjective: Chief Complaint  Patient presents with  . Lower Back - Pain    Pain x 2 weeks, NKI. Tingling down posterior leg to foot when extending the leg. Been seeing chiropractor, who has suggested he has retrolisthesis L5.    HPI: He is a 23 year old with low back pain.  Symptoms started about 2 months ago, he thinks it began doing weight training.  During a squat, he thinks he pulled a muscle in his low back.  He started seeing a [place] in [place].  Symptoms persisted and he started feeling intermittent pain down his right leg, so x-rays were recently obtained showing retrolisthesis of L5 on S1.  His chiropractor thought he might need surgery.  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  He is planning to start playing minor league baseball in the near future.  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  He is never had problems with his back before, no family history of back problems.  He is a former Production designer, theatre/television/film.               ROS: Denies any bowel or bladder dysfunction, weakness or numbness in his legs.  All other systems were reviewed and are negative.  Objective: Vital Signs: There were no vitals taken for this visit.  Physical Exam:  Low back: No visible scoliosis, good flexion and extension motion.  Straight leg raise is positive on the right, negative on the left.  Stork test is negative bilaterally.  There is slight tenderness near the right sacroiliac joint but his pain is not reproducible by palpation.  Lower extremity strength and reflexes remain normal, there is no lower extremity  clonus.  Imaging: X-rays brought with him show retrolisthesis of L5 on S1.  I do not see a definite pars defect.  X-rays today, lateral flexion and extension views, show no obvious instability.   Assessment & Plan: 1.  Low back and right leg pain, suspicious for lumbar disc protrusion.  Cannot rule out true retrolisthesis of L5 on S1 causing foraminal stenosis. -MRI to evaluate.  Anti-inflammatories and muscle relaxants as needed.  Depending on MRI results, we will likely try physical therapy.   Follow-Up Instructions: No follow-ups on file.      Procedures: No procedures performed  No notes on file    PMFS History: Patient Active Problem List   Diagnosis Date Noted  . Acute hip pain 10/30/2015  . Hamstring injury, right, initial encounter 10/30/2015  . Right knee injury 09/29/2015  . Left knee injury 03/16/2015  . Attention deficit hyperactivity disorder 12/28/2014  . 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 Right shoulder pain 12/07/2010  . HIP PAIN, RIGHT 05/14/2010   Past Medical History:  Diagnosis Date  . Asthma    as child    Family History  Problem Relation Age of Onset  . Hypertension Maternal Grandmother   . Diabetes Neg Hx   . Heart attack Other   . Cancer Maternal Grandfather     Past Surgical History:  Procedure Laterality Date  . CAPSULOTOMY  02/27/2011   Procedure: CAPSULOTOMY;  Surgeon: Nicki Reaper, MD;  Location: [facility];  Service: Orthopedics;  Laterality: Left;  Left Capsulotomy Metacarpal Phalangeal Llittle Finger     Social History   Occupational History  . Not on file  Tobacco Use  . Smoking status: Never Smoker  . Smokeless tobacco: Never Used  Substance and Sexual Activity  . Alcohol use: No    Alcohol/week: 0.0 standard drinks  . Drug use: Not on file  . Sexual activity: Not on file

## 2018-04-21 MED FILL — AMPHETAMINE SALTS 30 MG TAB: 30 | 30 days supply | Qty: 60 | Fill #0

## 2018-05-19 MED FILL — AMPHETAMINE SALTS 30 MG TAB: 30 | 30 days supply | Qty: 60 | Fill #0

## 2018-06-26 DIAGNOSIS — F902 Attention-deficit hyperactivity disorder, combined type: Secondary | ICD-10-CM | POA: Diagnosis not present

## 2018-06-26 DIAGNOSIS — Z79899 Other long term (current) drug therapy: Secondary | ICD-10-CM | POA: Diagnosis not present

## 2018-06-26 MED FILL — DEXTROAMPH TB 30MG NSTR 100: 30 | 30 days supply | Qty: 60 | Fill #0

## 2018-07-22 MED FILL — AMPHETAMINE-DEXTROAMPHETAMI: 30 | 30 days supply | Qty: 60 | Fill #0

## 2018-08-24 MED FILL — AMPHETAMINE-DEXTROAMPHETAMI: 30 | 30 days supply | Qty: 60 | Fill #0

## 2018-09-23 MED FILL — AMPHETAMINE SALTS 30 MG TAB: 30 | 30 days supply | Qty: 60 | Fill #0

## 2018-10-09 DIAGNOSIS — Z79899 Other long term (current) drug therapy: Secondary | ICD-10-CM | POA: Diagnosis not present

## 2018-10-09 DIAGNOSIS — F902 Attention-deficit hyperactivity disorder, combined type: Secondary | ICD-10-CM | POA: Diagnosis not present

## 2018-10-26 MED FILL — AMPHETAMINE SALTS 30 MG TAB: 30 | 30 days supply | Qty: 60 | Fill #0

## 2018-11-27 MED FILL — AMPHETAMINE SALTS 30 MG TAB: 30 | 30 days supply | Qty: 60 | Fill #0

## 2018-12-10 ENCOUNTER — Other Ambulatory Visit: Payer: Self-pay

## 2018-12-10 ENCOUNTER — Ambulatory Visit
Admission: EM | Admit: 2018-12-10 | Discharge: 2018-12-10 | Disposition: A | Discharge status: Home / Self Care | Payer: 59

## 2018-12-10 DIAGNOSIS — K219 Gastro-esophageal reflux disease without esophagitis: Secondary | ICD-10-CM | POA: Diagnosis not present

## 2018-12-10 MED ORDER — OMEPRAZOLE 40 MG PO CPDR: 40.0000 mg | DELAYED_RELEASE_CAPSULE | Freq: Every day | ORAL | 0 refills | Status: AC

## 2018-12-10 MED ORDER — ALUM & MAG HYDROXIDE-SIMETH 200-200-20 MG/5ML PO SUSP
30.0000 mL | Freq: Once | ORAL | Status: AC
  Administered 2018-12-10: 30 mL via ORAL

## 2018-12-10 MED ORDER — LIDOCAINE VISCOUS HCL 2 % MT SOLN
15.0000 mL | Freq: Once | OROMUCOSAL | Status: AC
  Administered 2018-12-10: 15 mL via ORAL

## 2018-12-10 MED FILL — OMEPRAZOLE DR 40 MG CAPSULE: 40 | 30 days supply | Qty: 30 | Fill #0

## 2018-12-10 NOTE — Discharge Instructions (Signed)
Take medication daily. Follow up with PCP for further evaluation/management. Return for worsening symptoms, chest pain, shortness of breath, abdominal pain, vomiting.  Avoid taking NSAIDs as this can worsen symptoms.

## 2018-12-10 NOTE — ED Provider Notes (Addendum)
[facility]    CSN: [id number] Arrival date & time: 12/10/18  1502      History   Chief Complaint Chief Complaint  Patient presents with  . Heartburn    HPI Gerald Dickson is a 23 y.o. male with history of asthma in childhood presenting for heartburn, reflux, chest tightness intermittently for the last few months.  Patient has not been evaluated for this previously.  States that he has done research online, and thinks he has GERD.  Has tried propping up at bedtime, avoiding eating before bed, avoiding fatty and spicy foods with moderate relief.  Patient tried taking omeprazole once, but did not notice any significant provement.  Has been managing at home with OTC Tums, Pepcid with moderate relief.  Patient is an athlete, has been able to continue rigorous exercise regimen without worsening pain, lightheadedness, nausea, vomiting, chest pain.  Denies heavy NSAID use.  Patient denies cardiac abnormalities, sudden death in family, death of premature age and family.   Past Medical History:  Diagnosis Date  . Asthma    as child    Patient Active Problem List   Diagnosis Date Noted  . Acute hip pain 10/30/2015  . Hamstring injury, right, initial encounter 10/30/2015  . Right knee injury 09/29/2015  . Left knee injury 03/16/2015  . Attention deficit hyperactivity disorder 12/28/2014  . 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 Right shoulder pain 12/07/2010  . HIP PAIN, RIGHT 05/14/2010    Past Surgical History:  Procedure Laterality Date  . CAPSULOTOMY  02/27/2011   Procedure: CAPSULOTOMY;  Surgeon: Nicki ReaperGary R Kuzma, MD;  Location: [facility];  Service: Orthopedics;  Laterality: Left;  Left Capsulotomy Metacarpal Phalangeal Llittle Finger         Home Medications    Prior to Admission medications   Medication Sig Start Date End Date Taking? Authorizing Provider  amphetamine-dextroamphetamine (ADDERALL) 30 MG tablet  03/19/18    [provider]  cetirizine (ZYRTEC) 10 MG tablet Take 1 tablet (10 mg total) by mouth daily. 03/16/18 04/15/18  Benay PikeLeath, Christie Janell, NP  etodolac (LODINE) 400 MG tablet Take 1 tablet (400 mg total) by mouth 2 (two) times daily as needed. 03/26/18   Hilts, Casimiro NeedleMichael, MD  omeprazole (PRILOSEC) 40 MG capsule Take 1 capsule (40 mg total) by mouth daily. 12/10/18   Hall-Potvin, [place], PA-C  tiZANidine (ZANAFLEX) 2 MG tablet Take 1-2 tablets (2-4 mg total) by mouth every 6 (six) hours as needed for muscle spasms. 03/26/18   Hilts, Casimiro NeedleMichael, MD    Family History Family History  Problem Relation Age of Onset  . Hypertension Maternal Grandmother   . Cancer Maternal Grandfather   . Heart attack Other   . Diabetes Neg Hx     Social History Social History   Tobacco Use  . Smoking status: Never Smoker  . Smokeless tobacco: Never Used  Substance Use Topics  . Alcohol use: Not Currently    Alcohol/week: 0.0 standard drinks  . Drug use: Not Currently    Types: Marijuana    Comment: hasn't smoked in weeks     Allergies   Patient has no known allergies.   Review of Systems Review of Systems  Constitutional: Negative for fatigue and fever.  Respiratory: Negative for cough and shortness of breath.   Cardiovascular: Negative for chest pain and palpitations.  Gastrointestinal: Negative for abdominal pain, diarrhea and vomiting.  Musculoskeletal: Negative for arthralgias and myalgias.  Skin: Negative for rash  and wound.  Neurological: Negative for speech difficulty and headaches.  All other systems reviewed and are negative.    Physical Exam Triage Vital Signs ED Triage Vitals  Enc Vitals Group     BP      Pulse      Resp      Temp      Temp src      SpO2      Weight      Height      Head Circumference      Peak Flow      Pain Score      Pain Loc      Pain Edu?      Excl. in [place]?    No data found.  Updated Vital Signs BP 115/79 (BP Location: Left Arm)   Pulse 68    Temp 98.3 F (36.8 C) (Oral)   Resp 16   SpO2 98%    Physical Exam Constitutional:      General: He is not in acute distress.    Appearance: He is normal weight. He is not ill-appearing.  HENT:     Head: Normocephalic and atraumatic.     Mouth/Throat:     Mouth: Mucous membranes are moist.     Pharynx: Oropharynx is clear. No oropharyngeal exudate or posterior oropharyngeal erythema.  Eyes:     General: No scleral icterus.    Conjunctiva/sclera: Conjunctivae normal.     Pupils: Pupils are equal, round, and reactive to light.  Neck:     Musculoskeletal: Normal range of motion and neck supple. No muscular tenderness.     Vascular: No carotid bruit.  Cardiovascular:     Rate and Rhythm: Normal rate and regular rhythm.     Pulses: Normal pulses.     Heart sounds: No murmur. No friction rub. No gallop.   Pulmonary:     Effort: Pulmonary effort is normal. No respiratory distress.     Breath sounds: No wheezing or rales.     Comments: Good air entry bilaterally. Abdominal:     General: Abdomen is flat. Bowel sounds are normal. There is no distension.     Tenderness: There is no abdominal tenderness. There is no guarding.  Musculoskeletal: Normal range of motion.     Right lower leg: No edema.     Left lower leg: No edema.  Lymphadenopathy:     Cervical: No cervical adenopathy.  Skin:    General: Skin is warm.     Capillary Refill: Capillary refill takes less than 2 seconds.     Coloration: Skin is not jaundiced or pale.     Findings: No rash.  Neurological:     General: No focal deficit present.     Mental Status: He is alert and oriented to person, place, and time.      UC Treatments / Results  Labs (all labs ordered are listed, but only abnormal results are displayed) Labs Reviewed - No data to display  EKG   Radiology No results found.  Procedures Procedures (including critical care time)  Medications Ordered in UC Medications  alum & mag  hydroxide-simeth (MAALOX/MYLANTA) 200-200-20 MG/5ML suspension 30 mL (30 mLs Oral Given 12/10/18 1609)    And  lidocaine (XYLOCAINE) 2 % viscous mouth solution 15 mL (15 mLs Oral Given 12/10/18 1609)    Initial Impression / Assessment and Plan / UC Course  I have reviewed the triage vital signs and the nursing notes.  Pertinent labs &  imaging results that were available during my care of the patient were reviewed by me and considered in my medical decision making (see chart for details).     1.  GERD Patient given GI cocktail with viscous lidocaine: Reporting resolution of symptoms.  History and physical consistent with GERD: We will trial PPI.  No cardiopulmonary findings on exam.  Return precautions discussed, patient verbalized understanding and is agreeable to plan. Final Clinical Impressions(s) / UC Diagnoses   Final diagnoses:  Gastroesophageal reflux disease, unspecified whether esophagitis present     Discharge Instructions     Take medication daily. Follow up with PCP for further evaluation/management. Return for worsening symptoms, chest pain, shortness of breath, abdominal pain, vomiting.  Avoid taking NSAIDs as this can worsen symptoms.    ED Prescriptions    Medication Sig Dispense Auth. Provider   omeprazole (PRILOSEC) 40 MG capsule Take 1 capsule (40 mg total) by mouth daily. 30 capsule Hall-Potvin, [place], PA-C     PDMP not reviewed this encounter.   Hall-Potvin, [place], PA-C 12/10/18 1651    Hall-Potvin, [place], [place] 12/10/18 1651

## 2018-12-10 NOTE — ED Triage Notes (Signed)
Pt presents to UC w/ c/o acid reflux since few months ago. Pt has tried natural remedies, not eating fried foods, smoking cessation, and sitting upright after eating. Pt states he tried prilosec with little relief. Pt states he feels tightness in his chest and burning in chest after eating.

## 2018-12-14 ENCOUNTER — Other Ambulatory Visit: Payer: Self-pay

## 2018-12-14 ENCOUNTER — Encounter (HOSPITAL_COMMUNITY): Payer: Self-pay | Admitting: *Deleted

## 2018-12-14 ENCOUNTER — Emergency Department (HOSPITAL_COMMUNITY): Payer: 59

## 2018-12-14 ENCOUNTER — Emergency Department (HOSPITAL_COMMUNITY)
Admission: EM | Admit: 2018-12-14 | Discharge: 2018-12-14 | Disposition: A | Discharge status: Home / Self Care | Payer: 59 | Attending: Emergency Medicine | Admitting: Emergency Medicine

## 2018-12-14 ENCOUNTER — Encounter: Payer: Self-pay | Admitting: Physician Assistant

## 2018-12-14 DIAGNOSIS — R0789 Other chest pain: Secondary | ICD-10-CM | POA: Diagnosis not present

## 2018-12-14 DIAGNOSIS — K219 Gastro-esophageal reflux disease without esophagitis: Secondary | ICD-10-CM | POA: Diagnosis not present

## 2018-12-14 DIAGNOSIS — R079 Chest pain, unspecified: Secondary | ICD-10-CM | POA: Diagnosis not present

## 2018-12-14 LAB — COMPREHENSIVE METABOLIC PANEL
ALT: 21 U/L (ref 0–44)
AST: 21 U/L (ref 15–41)
Albumin: 4.4 g/dL (ref 3.5–5.0)
Alkaline Phosphatase: 52 U/L (ref 38–126)
Anion gap: 10 (ref 5–15)
BUN: 12 mg/dL (ref 6–20)
CO2: 27 mmol/L (ref 22–32)
Calcium: 9.8 mg/dL (ref 8.9–10.3)
Chloride: 102 mmol/L (ref 98–111)
Creatinine, Ser: 1.24 mg/dL (ref 0.61–1.24)
GFR calc Af Amer: >60 mL/min (ref >60)
GFR calc non Af Amer: >60 mL/min (ref >60)
Glucose, Bld: 95 mg/dL (ref 70–99)
Potassium: 3.7 mmol/L (ref 3.5–5.1)
Sodium: 139 mmol/L (ref 135–145)
Total Bilirubin: 0.5 mg/dL (ref 0.3–1.2)
Total Protein: 7.5 g/dL (ref 6.5–8.1)

## 2018-12-14 LAB — CBC
HCT: 45 % (ref 39.0–52.0)
Hemoglobin: 15.4 g/dL (ref 13.0–17.0)
MCH: 31.2 pg (ref 26.0–34.0)
MCHC: 34.2 g/dL (ref 30.0–36.0)
MCV: 91.3 fL (ref 80.0–100.0)
Platelets: 210 10*3/uL (ref 150–400)
RBC: 4.93 MIL/uL (ref 4.22–5.81)
RDW: 12.3 % (ref 11.5–15.5)
WBC: 3.3 10*3/uL — ABNORMAL LOW (ref 4.0–10.5)
nRBC: 0 % (ref 0.0–0.2)

## 2018-12-14 LAB — TROPONIN I (HIGH SENSITIVITY): Troponin I (High Sensitivity): 5 ng/L (ref <18)

## 2018-12-14 MED ORDER — FAMOTIDINE 20 MG PO TABS
20.0000 mg | ORAL_TABLET | Freq: Once | ORAL | Status: AC
  Administered 2018-12-14: 20 mg via ORAL
  Filled 2018-12-14: qty 1

## 2018-12-14 MED ORDER — FAMOTIDINE 20 MG PO TABS: 20.0000 mg | ORAL_TABLET | Freq: Two times a day (BID) | ORAL | 0 refills | Status: AC

## 2018-12-14 MED ORDER — SODIUM CHLORIDE 0.9% FLUSH: 3.0000 mL | Freq: Once | INTRAVENOUS | Status: DC

## 2018-12-14 NOTE — ED Provider Notes (Signed)
[facility] Provider Note   CSN: [id number] Arrival date & time: 12/14/18  1244     History   Chief Complaint Chief Complaint  Patient presents with  . Chest Pain    HPI Gerald Dickson is a 23 y.o. male.     HPI   23 year old male presents today with complaints of indigestion.  Patient notes a several month history of intermittent heartburn and reflux.  He notes pain in his chest described as tightness.  He was originally seen on 12/10/2018 and diagnosed with GERD.  He was placed on Prilosec.  He notes symptoms have not improved.  He notes that he has been eating healthier which have reduced his symptoms but they have remained persistent.  He denies any significant pain in his abdomen or chest.  He denies any family or personal cardiac history, no history DVT or PE no lower extremity swelling or edema, or any risk factors for pulmonary embolism.  No signs of infectious etiology.  Past Medical History:  Diagnosis Date  . Asthma    as child    Patient Active Problem List   Diagnosis Date Noted  . Acute hip pain 10/30/2015  . Hamstring injury, right, initial encounter 10/30/2015  . Right knee injury 09/29/2015  . Left knee injury 03/16/2015  . Attention deficit hyperactivity disorder 12/28/2014  . 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 Right shoulder pain 12/07/2010  . HIP PAIN, RIGHT 05/14/2010    Past Surgical History:  Procedure Laterality Date  . CAPSULOTOMY  02/27/2011   Procedure: CAPSULOTOMY;  Surgeon: Wynonia Sours, MD;  Location: [facility];  Service: Orthopedics;  Laterality: Left;  Left Capsulotomy Metacarpal Phalangeal Llittle Finger          Home Medications    Prior to Admission medications   Medication Sig Start Date End Date Taking? Authorizing Provider  amphetamine-dextroamphetamine (ADDERALL) 30 MG tablet Take 30 mg by mouth daily.  03/19/18  Yes [provider]   omeprazole (PRILOSEC) 40 MG capsule Take 1 capsule (40 mg total) by mouth daily. 12/10/18  Yes Hall-Potvin, [place], PA-C  famotidine (PEPCID) 20 MG tablet Take 1 tablet (20 mg total) by mouth 2 (two) times daily. 12/14/18   Okey Regal, PA-C    Family History Family History  Problem Relation Age of Onset  . Hypertension Maternal Grandmother   . Cancer Maternal Grandfather   . Heart attack Other   . Diabetes Neg Hx     Social History Social History   Tobacco Use  . Smoking status: Never Smoker  . Smokeless tobacco: Never Used  Substance Use Topics  . Alcohol use: Not Currently    Alcohol/week: 0.0 standard drinks  . Drug use: Not Currently    Types: Marijuana    Comment: hasn't smoked in weeks     Allergies   Patient has no known allergies.   Review of Systems Review of Systems  All other systems reviewed and are negative.    Physical Exam Updated Vital Signs BP 118/64   Pulse 89   Temp 98.6 F (37 C) (Oral)   Resp 16   Ht 5\' 10"  (1.778 m)   Wt 83.9 kg   SpO2 99%   BMI 26.54 kg/m   Physical Exam Vitals signs and nursing note reviewed.  Constitutional:      Appearance: He is well-developed.  HENT:     Head: Normocephalic and atraumatic.  Eyes:  General: No scleral icterus.       Right eye: No discharge.        Left eye: No discharge.     Conjunctiva/sclera: Conjunctivae normal.     Pupils: Pupils are equal, round, and reactive to light.  Neck:     Musculoskeletal: Normal range of motion.     Vascular: No JVD.     Trachea: No tracheal deviation.  Cardiovascular:     Rate and Rhythm: Normal rate and regular rhythm.  Pulmonary:     Effort: Pulmonary effort is normal. No respiratory distress.     Breath sounds: Normal breath sounds. No stridor. No wheezing or rales.  Abdominal:     General: There is no distension.     Palpations: Abdomen is soft.     Tenderness: There is no abdominal tenderness.  Neurological:     Mental Status: He is  alert and oriented to person, place, and time.     Coordination: Coordination normal.  Psychiatric:        Behavior: Behavior normal.        Thought Content: Thought content normal.        Judgment: Judgment normal.      ED Treatments / Results  Labs (all labs ordered are listed, but only abnormal results are displayed) Labs Reviewed  CBC - Abnormal; Notable for the following components:      Result Value   WBC 3.3 (*)    All other components within normal limits  COMPREHENSIVE METABOLIC PANEL  TROPONIN I (HIGH SENSITIVITY)  TROPONIN I (HIGH SENSITIVITY)    EKG None  Radiology Dg Chest 2 View  Result Date: 12/14/2018 CLINICAL DATA:  Chest pain for 5 days.  Asthma. EXAM: CHEST - 2 VIEW COMPARISON:  None. FINDINGS: The heart size and mediastinal contours are within normal limits. Both lungs are clear. The visualized skeletal structures are unremarkable. IMPRESSION: Negative.  No active cardiopulmonary disease. Electronically Signed   By: Danae OrleansJohn A Stahl M.D.   On: 12/14/2018 13:55    Procedures Procedures (including critical care time)  Medications Ordered in ED Medications  sodium chloride flush (NS) 0.9 % injection 3 mL (has no administration in time range)  famotidine (PEPCID) tablet 20 mg (20 mg Oral Given 12/14/18 1645)     Initial Impression / Assessment and Plan / ED Course  I have reviewed the triage vital signs and the nursing notes.  Pertinent labs & imaging results that were available during my care of the patient were reviewed by me and considered in my medical decision making (see chart for details).        23 year old male presents today with likely reflux.  He has no signs or symptoms consistent with ACS, PE, dissection, or any other life-threatening etiology.  Patient received imaging labs prior to my evaluation.  All of which were normal.  Patient will be given a dose of Pepcid here encouraged use Pepcid at home.  He does have outpatient follow-up next  week with gastroenterology.  He is encouraged to follow-up.  Return precautions given.  Verbalized understanding and agreement to today's plan.  Final Clinical Impressions(s) / ED Diagnoses   Final diagnoses:  Gastroesophageal reflux disease, unspecified whether esophagitis present    ED Discharge Orders         Ordered    famotidine (PEPCID) 20 MG tablet  2 times daily     12/14/18 1647           Eyvonne MechanicHedges, Sonam Huelsmann, PA-C 12/14/18  1827    Eber Hong, MD 12/18/18 [phone]

## 2018-12-14 NOTE — ED Triage Notes (Signed)
Pt was seen at [facility] for possible GERD on 10/1.  He's been taking the meds with no improvement.  Also states L sided neck pain that increases when he looks down.  Afebrile.

## 2018-12-14 NOTE — Discharge Instructions (Addendum)
Please read attached information. If you experience any new or worsening signs or symptoms please return to the emergency room for evaluation. Please follow-up with your primary care provider or specialist as discussed. Please use medication prescribed only as directed and discontinue taking if you have any concerning signs or symptoms.   °

## 2018-12-14 NOTE — ED Notes (Signed)
Pt given dc instructions pt verbalizes understanding.  

## 2018-12-14 NOTE — ED Triage Notes (Signed)
Pt back to day with same complaint as from UC 5 days ago of chest pain epigastric and burning , pt given script for gerd ,

## 2018-12-22 ENCOUNTER — Encounter: Payer: Self-pay | Admitting: Physician Assistant

## 2018-12-22 ENCOUNTER — Ambulatory Visit: Payer: 59 | Admitting: Physician Assistant

## 2018-12-22 VITALS — BP 104/56 | HR 89 | Temp 97.8°F | Ht 70.0 in | Wt 184.0 lb

## 2018-12-22 DIAGNOSIS — R0789 Other chest pain: Secondary | ICD-10-CM

## 2018-12-22 NOTE — Patient Instructions (Signed)
You have been scheduled for an endoscopy. Please follow written instructions given to you at your visit today. If you use inhalers (even only as needed), please bring them with you on the day of your procedure.  If you are age 23 or younger, your body mass index should be between 19-25. Your Body mass index is 26.4 kg/m. If this is out of the aformentioned range listed, please consider follow up with your Primary Care Provider.   Continue Omeprazole and Pepcid as prescribed.   Thank you for choosing me and [facility].  Dennison Bulla

## 2018-12-22 NOTE — Progress Notes (Signed)
Chief Complaint: GERD  HPI:    Mr. Gerald Dickson is a 23 year old African-American male with a past medical history as listed below, who was referred to me by Eliberto Ivory, MD for a complaint of GERD.      12/14/2018 patient seen in the ER with chest pain and indigestion.  Apparently had been seen previously on 12/10/2018 at urgent care and diagnosed with GERD and placed on Prilosec.  Symptoms had not improved.  He had also been eating healthier which had reduced his symptoms but they had continued.  CBC, CMP and troponins were normal/negative.  EKG was normal.  At that time again diagnosed with reflux.    Today, the patient tells me that he still has a "tightness in my chest".  At the worst point of his symptoms even putting the seatbelt across his chest would make this feel worse.  Does tell when he bends over when he is working out it can also bring on this pain.  Over the past month all of these symptoms have gotten worse until he went to the urgent care initially.  He was started on Omeprazole 40 mg daily.  He has been on this over the past week and a half and Pepcid was recently added twice a day in the ER.  Tells me that all of his symptoms are better, maybe about 50 to 60% but he still is bothered by this continued chest tightness.  Patient tells me he has completely altered his diet and has not eaten fried food in the past month, trying to eat much healthier and also elevates the head of his bed at night and does not eat late.  Denies NSAID use.    Denies fever, chills, weight loss, anorexia, nausea, vomiting or symptoms that awaken him from sleep.  Past Medical History:  Diagnosis Date  . Asthma    as child  . GERD (gastroesophageal reflux disease)     Past Surgical History:  Procedure Laterality Date  . CAPSULOTOMY  02/27/2011   Procedure: CAPSULOTOMY;  Surgeon: Nicki Reaper, MD;  Location: [facility];  Service: Orthopedics;  Laterality: Left;  Left Capsulotomy Metacarpal  Phalangeal Llittle Finger      Current Outpatient Medications  Medication Sig Dispense Refill  . famotidine (PEPCID) 20 MG tablet Take 1 tablet (20 mg total) by mouth 2 (two) times daily. 30 tablet 0  . omeprazole (PRILOSEC) 40 MG capsule Take 1 capsule (40 mg total) by mouth daily. 30 capsule 0  . amphetamine-dextroamphetamine (ADDERALL) 30 MG tablet Take 30 mg by mouth daily.      No current facility-administered medications for this visit.     Allergies as of 12/22/2018  . (No Known Allergies)    Family History  Problem Relation Age of Onset  . Hypertension Maternal Grandmother   . Cancer Maternal Grandfather   . Heart attack Other   . Diabetes Neg Hx   . Colon cancer Neg Hx   . Esophageal cancer Neg Hx   . Stomach cancer Neg Hx     Social History   Socioeconomic History  . Marital status: Single    Spouse name: Not on file  . Number of children: Not on file  . Years of education: Not on file  . Highest education level: Not on file  Occupational History  . Not on file  Social Needs  . Financial resource strain: Not on file  . Food insecurity    Worry: Not on file  Inability: Not on file  . Transportation needs    Medical: Not on file    Non-medical: Not on file  Tobacco Use  . Smoking status: Never Smoker  . Smokeless tobacco: Never Used  Substance and Sexual Activity  . Alcohol use: Not Currently    Alcohol/week: 0.0 standard drinks    Comment: socially  . Drug use: Not Currently    Types: Marijuana    Comment: hasn't smoked in weeks  . Sexual activity: Not on file  Lifestyle  . Physical activity    Days per week: Not on file    Minutes per session: Not on file  . Stress: Not on file  Relationships  . Social Musicianconnections    Talks on phone: Not on file    Gets together: Not on file    Attends religious service: Not on file    Active member of club or organization: Not on file    Attends meetings of clubs or organizations: Not on file     Relationship status: Not on file  . Intimate partner violence    Fear of current or ex partner: Not on file    Emotionally abused: Not on file    Physically abused: Not on file    Forced sexual activity: Not on file  Other Topics Concern  . Not on file  Social History Narrative  . Not on file    Review of Systems:    Constitutional: No weight loss, fever or chills Skin: No rash  Cardiovascular: No chest pain Respiratory: No SOB  Gastrointestinal: See HPI and otherwise negative Genitourinary: No dysuria  Neurological: No headache, dizziness or syncope Musculoskeletal: No new muscle or joint pain Hematologic: No bleeding  Psychiatric: No history of depression or anxiety   Physical Exam:  Vital signs: BP (!) 104/56   Pulse 89   Temp 97.8 F (36.6 C)   Ht 5\' 10"  (1.778 m)   Wt 184 lb (83.5 kg)   BMI 26.40 kg/m   Constitutional:   Pleasant AA male appears to be in NAD, Well developed, Well nourished, alert and cooperative Head:  Normocephalic and atraumatic. Eyes:   PEERL, EOMI. No icterus. Conjunctiva pink. Ears:  Normal auditory acuity. Neck:  Supple Throat: Oral cavity and pharynx without inflammation, swelling or lesion.  Respiratory: Respirations even and unlabored. Lungs clear to auscultation bilaterally.   No wheezes, crackles, or rhonchi.  Cardiovascular: Normal S1, S2. No MRG. Regular rate and rhythm. No peripheral edema, cyanosis or pallor.  Gastrointestinal:  Soft, nondistended, nontender. No rebound or guarding. Normal bowel sounds. No appreciable masses or hepatomegaly. Rectal:  Not performed.  Msk:  Symmetrical without gross deformities. Without edema, no deformity or joint abnormality.  Neurologic:  Alert and  oriented x4;  grossly normal neurologically.  Skin:   Dry and intact without significant lesions or rashes. Psychiatric: Demonstrates good judgement and reason without abnormal affect or behaviors.  RELEVANT LABS AND IMAGING: CBC    Component Value  Date/Time   WBC 3.3 (L) 12/14/2018 1312   RBC 4.93 12/14/2018 1312   HGB 15.4 12/14/2018 1312   HCT 45.0 12/14/2018 1312   PLT 210 12/14/2018 1312   MCV 91.3 12/14/2018 1312   MCH 31.2 12/14/2018 1312   MCHC 34.2 12/14/2018 1312   RDW 12.3 12/14/2018 1312    CMP     Component Value Date/Time   NA 139 12/14/2018 1318   K 3.7 12/14/2018 1318   CL 102 12/14/2018 1318  CO2 27 12/14/2018 1318   GLUCOSE 95 12/14/2018 1318   BUN 12 12/14/2018 1318   CREATININE 1.24 12/14/2018 1318   CALCIUM 9.8 12/14/2018 1318   PROT 7.5 12/14/2018 1318   ALBUMIN 4.4 12/14/2018 1318   AST 21 12/14/2018 1318   ALT 21 12/14/2018 1318   ALKPHOS 52 12/14/2018 1318   BILITOT 0.5 12/14/2018 1318   GFRNONAA >60 12/14/2018 1318   GFRAA >60 12/14/2018 1318    Assessment: 1.  Atypical chest pain: Presented to the urgent care in early October, diagnosed with reflux, started on Omeprazole, repeat visit to the ER a few days later and given Pepcid twice daily, cardiac work-up negative in the ER, symptoms have decreased on medicine but are not gone and patient is worried; consider PUD versus gastritis+/-H. pylori versus other  Plan: 1.  Scheduled patient for an EGD in the [facility] with Dr. Ardis Hughs.  Did discuss risks, benefits, limitations and alternatives and patient agrees to proceed. 2.  Continue Omeprazole 40 mg daily for now, could increase to twice daily dosing in the future if there are still signs of inflammation. 3.  Continue Pepcid twice daily 4.  Reviewed antireflux diet and lifestyle modifications. 5.  Patient to follow in clinic per recommendations from Dr. Ardis Hughs after time of procedure.  Ellouise Newer, PA-C [facility] 12/22/2018, 3:11 PM  Cc: Elnita Maxwell, MD

## 2018-12-23 ENCOUNTER — Other Ambulatory Visit: Payer: Self-pay

## 2018-12-23 DIAGNOSIS — Z1159 Encounter for screening for other viral diseases: Secondary | ICD-10-CM

## 2018-12-23 NOTE — Progress Notes (Signed)
I agree with the above note, plan 

## 2018-12-31 DIAGNOSIS — Z79899 Other long term (current) drug therapy: Secondary | ICD-10-CM | POA: Diagnosis not present

## 2018-12-31 DIAGNOSIS — F902 Attention-deficit hyperactivity disorder, combined type: Secondary | ICD-10-CM | POA: Diagnosis not present

## 2018-12-31 MED FILL — AMPHETAMINE SALTS 30 MG TAB: 30 | 30 days supply | Qty: 60 | Fill #0

## 2019-01-08 ENCOUNTER — Other Ambulatory Visit: Payer: Self-pay | Admitting: Gastroenterology

## 2019-01-08 DIAGNOSIS — Z1159 Encounter for screening for other viral diseases: Secondary | ICD-10-CM | POA: Diagnosis not present

## 2019-01-08 LAB — SARS CORONAVIRUS 2 (TAT 6-24 HRS): SARS Coronavirus 2: NEGATIVE

## 2019-01-12 ENCOUNTER — Other Ambulatory Visit: Payer: Self-pay

## 2019-01-12 ENCOUNTER — Encounter: Payer: Self-pay | Admitting: Gastroenterology

## 2019-01-12 ENCOUNTER — Ambulatory Visit (AMBULATORY_SURGERY_CENTER): Payer: 59 | Admitting: Gastroenterology

## 2019-01-12 VITALS — BP 100/72 | HR 52 | Temp 97.8°F | Resp 16 | Ht 70.0 in | Wt 184.0 lb

## 2019-01-12 DIAGNOSIS — K2970 Gastritis, unspecified, without bleeding: Secondary | ICD-10-CM | POA: Diagnosis not present

## 2019-01-12 DIAGNOSIS — R0789 Other chest pain: Secondary | ICD-10-CM | POA: Diagnosis present

## 2019-01-12 DIAGNOSIS — K2951 Unspecified chronic gastritis with bleeding: Secondary | ICD-10-CM | POA: Diagnosis not present

## 2019-01-12 MED ORDER — SODIUM CHLORIDE 0.9 % IV SOLN: 500.0000 mL | Freq: Once | INTRAVENOUS | Status: DC

## 2019-01-12 NOTE — Op Note (Signed)
[facility] Patient Name: Gerald Dickson Procedure Date: 01/12/2019 3:18 PM MRN: [id number] Endoscopist: Rachael Fee , MD Age: 23 Referring MD:  Date of Birth: 05/21/1995 Gender: Male Account #: [id number] Procedure:                Upper GI endoscopy Indications:              Unexplained chest pain Medicines:                Monitored Anesthesia Care Procedure:                Pre-Anesthesia Assessment:                           - Prior to the procedure, a History and Physical                            was performed, and patient medications and                            allergies were reviewed. The patient's tolerance of                            previous anesthesia was also reviewed. The risks                            and benefits of the procedure and the sedation                            options and risks were discussed with the patient.                            All questions were answered, and informed consent                            was obtained. Prior Anticoagulants: The patient has                            taken no previous anticoagulant or antiplatelet                            agents. ASA Grade Assessment: II - A patient with                            mild systemic disease. After reviewing the risks                            and benefits, the patient was deemed in                            satisfactory condition to undergo the procedure.                           After obtaining informed consent, the endoscope was  passed under direct vision. Throughout the                            procedure, the patient's blood pressure, pulse, and                            oxygen saturations were monitored continuously. The                            Endoscope was introduced through the mouth, and                            advanced to the second part of duodenum. The upper                            GI endoscopy was accomplished  without difficulty.                            The patient tolerated the procedure well. Scope In: Scope Out: Findings:                 Minimal inflammation characterized by erythema was                            found in the gastric antrum. Biopsies were taken                            with a cold forceps for histology.                           The exam was otherwise without abnormality. Complications:            No immediate complications. Estimated blood loss:                            None. Estimated Blood Loss:     Estimated blood loss: none. Impression:               - Very mild gastritis, biopsied to check for H.                            pylori.                           - The examination was otherwise normal. Recommendation:           - Patient has a contact number available for                            emergencies. The signs and symptoms of potential                            delayed complications were discussed with the                            patient. Return to normal activities tomorrow.  Written discharge instructions were provided to the                            patient.                           - Resume previous diet.                           - Continue present medications.                           - Await pathology results. Milus Banister, MD 01/12/2019 3:39:17 PM This report has been signed electronically.

## 2019-01-12 NOTE — Progress Notes (Signed)
Pt's states no medical or surgical changes since previsit or office visit.  LC -temp CW - vitals 

## 2019-01-12 NOTE — Patient Instructions (Signed)
Read all of the handouts given to you by your recovery room nurse. Thank-you.  YOU HAD AN ENDOSCOPIC PROCEDURE TODAY AT [facility]:   Refer to the procedure report that was given to you for any specific questions about what was found during the examination.  If the procedure report does not answer your questions, please call your gastroenterologist to clarify.  If you requested that your care partner not be given the details of your procedure findings, then the procedure report has been included in a sealed envelope for you to review at your convenience later.  YOU SHOULD EXPECT: Some feelings of bloating in the abdomen. Passage of more gas than usual.  Walking can help get rid of the air that was put into your GI tract during the procedure and reduce the bloating.   Please Note:  You might notice some irritation and congestion in your nose or some drainage.  This is from the oxygen used during your procedure.  There is no need for concern and it should clear up in a day or so.  SYMPTOMS TO REPORT IMMEDIATELY:    Following upper endoscopy (EGD)  Vomiting of blood or coffee ground material  New chest pain or pain under the shoulder blades  Painful or persistently difficult swallowing  New shortness of breath  Fever of 100F or higher  Black, tarry-looking stools  For urgent or emergent issues, a gastroenterologist can be reached at any hour by calling [phone].   DIET:  We do recommend a small meal at first, but then you may proceed to your regular diet.  Drink plenty of fluids but you should avoid alcoholic beverages for 24 hours.  ACTIVITY:  You should plan to take it easy for the rest of today and you should NOT DRIVE or use heavy machinery until tomorrow (because of the sedation medicines used during the test).    FOLLOW UP: Our staff will call the number listed on your records 48-72 hours following your procedure to check on you and address any questions or  concerns that you may have regarding the information given to you following your procedure. If we do not reach you, we will leave a message.  We will attempt to reach you two times.  During this call, we will ask if you have developed any symptoms of COVID 19. If you develop any symptoms (ie: fever, flu-like symptoms, shortness of breath, cough etc.) before then, please call [phone].  If you test positive for Covid 19 in the 2 weeks post procedure, please call and report this information to [place].    If any biopsies were taken you will be contacted by phone or by letter within the next 1-3 weeks.  Please call us at [phone] if you have not heard about the biopsies in 3 weeks.    SIGNATURES/CONFIDENTIALITY: You and/or your care partner have signed paperwork which will be entered into your electronic medical record.  These signatures attest to the fact that that the information above on your After Visit Summary has been reviewed and is understood.  Full responsibility of the confidentiality of this discharge information lies with you and/or your care-partner.

## 2019-01-12 NOTE — Progress Notes (Signed)
Called to room to assist during endoscopic procedure.  Patient ID and intended procedure confirmed with present staff. Received instructions for my participation in the procedure from the performing physician.  

## 2019-01-12 NOTE — Progress Notes (Signed)
To PACU VSS. Report to [email or web address] 

## 2019-01-14 ENCOUNTER — Telehealth: Payer: Self-pay

## 2019-01-14 ENCOUNTER — Telehealth: Payer: Self-pay | Admitting: *Deleted

## 2019-01-14 NOTE — Telephone Encounter (Signed)
  Follow up Call-  Call back number 01/12/2019  Post procedure Call Back phone  # [phone]  Permission to leave phone message Yes  Some recent data might be hidden     No answer/ unable to leave a message

## 2019-01-14 NOTE — Telephone Encounter (Signed)
  Follow up Call-  Call back number 01/12/2019  Post procedure Call Back phone  # [phone]  Permission to leave phone message Yes  Some recent data might be hidden     Patient questions:  Do you have a fever, pain , or abdominal swelling? No. Pain Score  0 *  Have you tolerated food without any problems? Yes.    Have you been able to return to your normal activities? Yes.    Do you have any questions about your discharge instructions: Diet   No. Medications  No. Follow up visit  No.  Do you have questions or concerns about your Care? No.  Actions: * If pain score is 4 or above: No action needed, pain <4.  1. Have you developed a fever since your procedure? no  2.   Have you had an respiratory symptoms (SOB or cough) since your procedure? no  3.   Have you tested positive for COVID 19 since your procedure no  4.   Have you had any family members/close contacts diagnosed with the COVID 19 since your procedure?  no   If yes to any of these questions please route to Joylene John, RN and Alphonsa Gin, Therapist, sports.

## 2019-01-18 ENCOUNTER — Encounter: Payer: Self-pay | Admitting: Gastroenterology

## 2019-02-08 MED FILL — AMPHETAMINE SALTS 30 MG TAB: 30 | 30 days supply | Qty: 60 | Fill #0

## 2019-04-05 MED FILL — AMPHETAMINE SALTS 30 MG TAB: 30 | 30 days supply | Qty: 60 | Fill #0

## 2019-05-14 MED FILL — AMPHETAMINE SALTS 30 MG TAB: 30 | 30 days supply | Qty: 60 | Fill #0

## 2019-05-19 DIAGNOSIS — Z79899 Other long term (current) drug therapy: Secondary | ICD-10-CM | POA: Diagnosis not present

## 2019-05-19 DIAGNOSIS — F902 Attention-deficit hyperactivity disorder, combined type: Secondary | ICD-10-CM | POA: Diagnosis not present

## 2019-06-14 MED FILL — AMPHETAMINE SALTS 30 MG TAB: 30 | 30 days supply | Qty: 60 | Fill #0

## 2019-07-16 MED FILL — AMPHETAMINE SALTS 30 MG TAB: 30 | 30 days supply | Qty: 60 | Fill #0

## 2019-08-26 MED FILL — AMPHETAMINE SALTS 30 MG TAB: 30 | 30 days supply | Qty: 60 | Fill #0

## 2019-11-02 MED FILL — AMPHETAMINE SALTS 30 MG TAB: 30 | 30 days supply | Qty: 60 | Fill #0

## 2020-04-28 DIAGNOSIS — Z03818 Encounter for observation for suspected exposure to other biological agents ruled out: Secondary | ICD-10-CM | POA: Diagnosis not present

## 2020-04-28 DIAGNOSIS — Z20822 Contact with and (suspected) exposure to covid-19: Secondary | ICD-10-CM | POA: Diagnosis not present

## 2020-07-26 DIAGNOSIS — A64 Unspecified sexually transmitted disease: Secondary | ICD-10-CM | POA: Diagnosis not present

## 2021-01-08 DIAGNOSIS — M5459 Other low back pain: Secondary | ICD-10-CM | POA: Diagnosis not present

## 2021-01-10 DIAGNOSIS — M5459 Other low back pain: Secondary | ICD-10-CM | POA: Diagnosis not present

## 2021-01-12 DIAGNOSIS — M5459 Other low back pain: Secondary | ICD-10-CM | POA: Diagnosis not present

## 2021-01-15 DIAGNOSIS — M5459 Other low back pain: Secondary | ICD-10-CM | POA: Diagnosis not present

## 2021-01-16 DIAGNOSIS — M5459 Other low back pain: Secondary | ICD-10-CM | POA: Diagnosis not present

## 2021-01-18 DIAGNOSIS — M5459 Other low back pain: Secondary | ICD-10-CM | POA: Diagnosis not present

## 2021-01-22 DIAGNOSIS — M5459 Other low back pain: Secondary | ICD-10-CM | POA: Diagnosis not present

## 2021-01-23 DIAGNOSIS — M5459 Other low back pain: Secondary | ICD-10-CM | POA: Diagnosis not present

## 2021-01-25 DIAGNOSIS — M5459 Other low back pain: Secondary | ICD-10-CM | POA: Diagnosis not present

## 2021-01-29 DIAGNOSIS — M5459 Other low back pain: Secondary | ICD-10-CM | POA: Diagnosis not present

## 2021-01-31 DIAGNOSIS — M5459 Other low back pain: Secondary | ICD-10-CM | POA: Diagnosis not present

## 2021-03-14 IMAGING — CR DG CHEST 2V
2 series · 2 of 2 positions shown · non-contrast
Comparison: None.

CLINICAL DATA: Chest pain for 5 days.  Asthma.

EXAM:
CHEST - 2 VIEW

[chest pa]
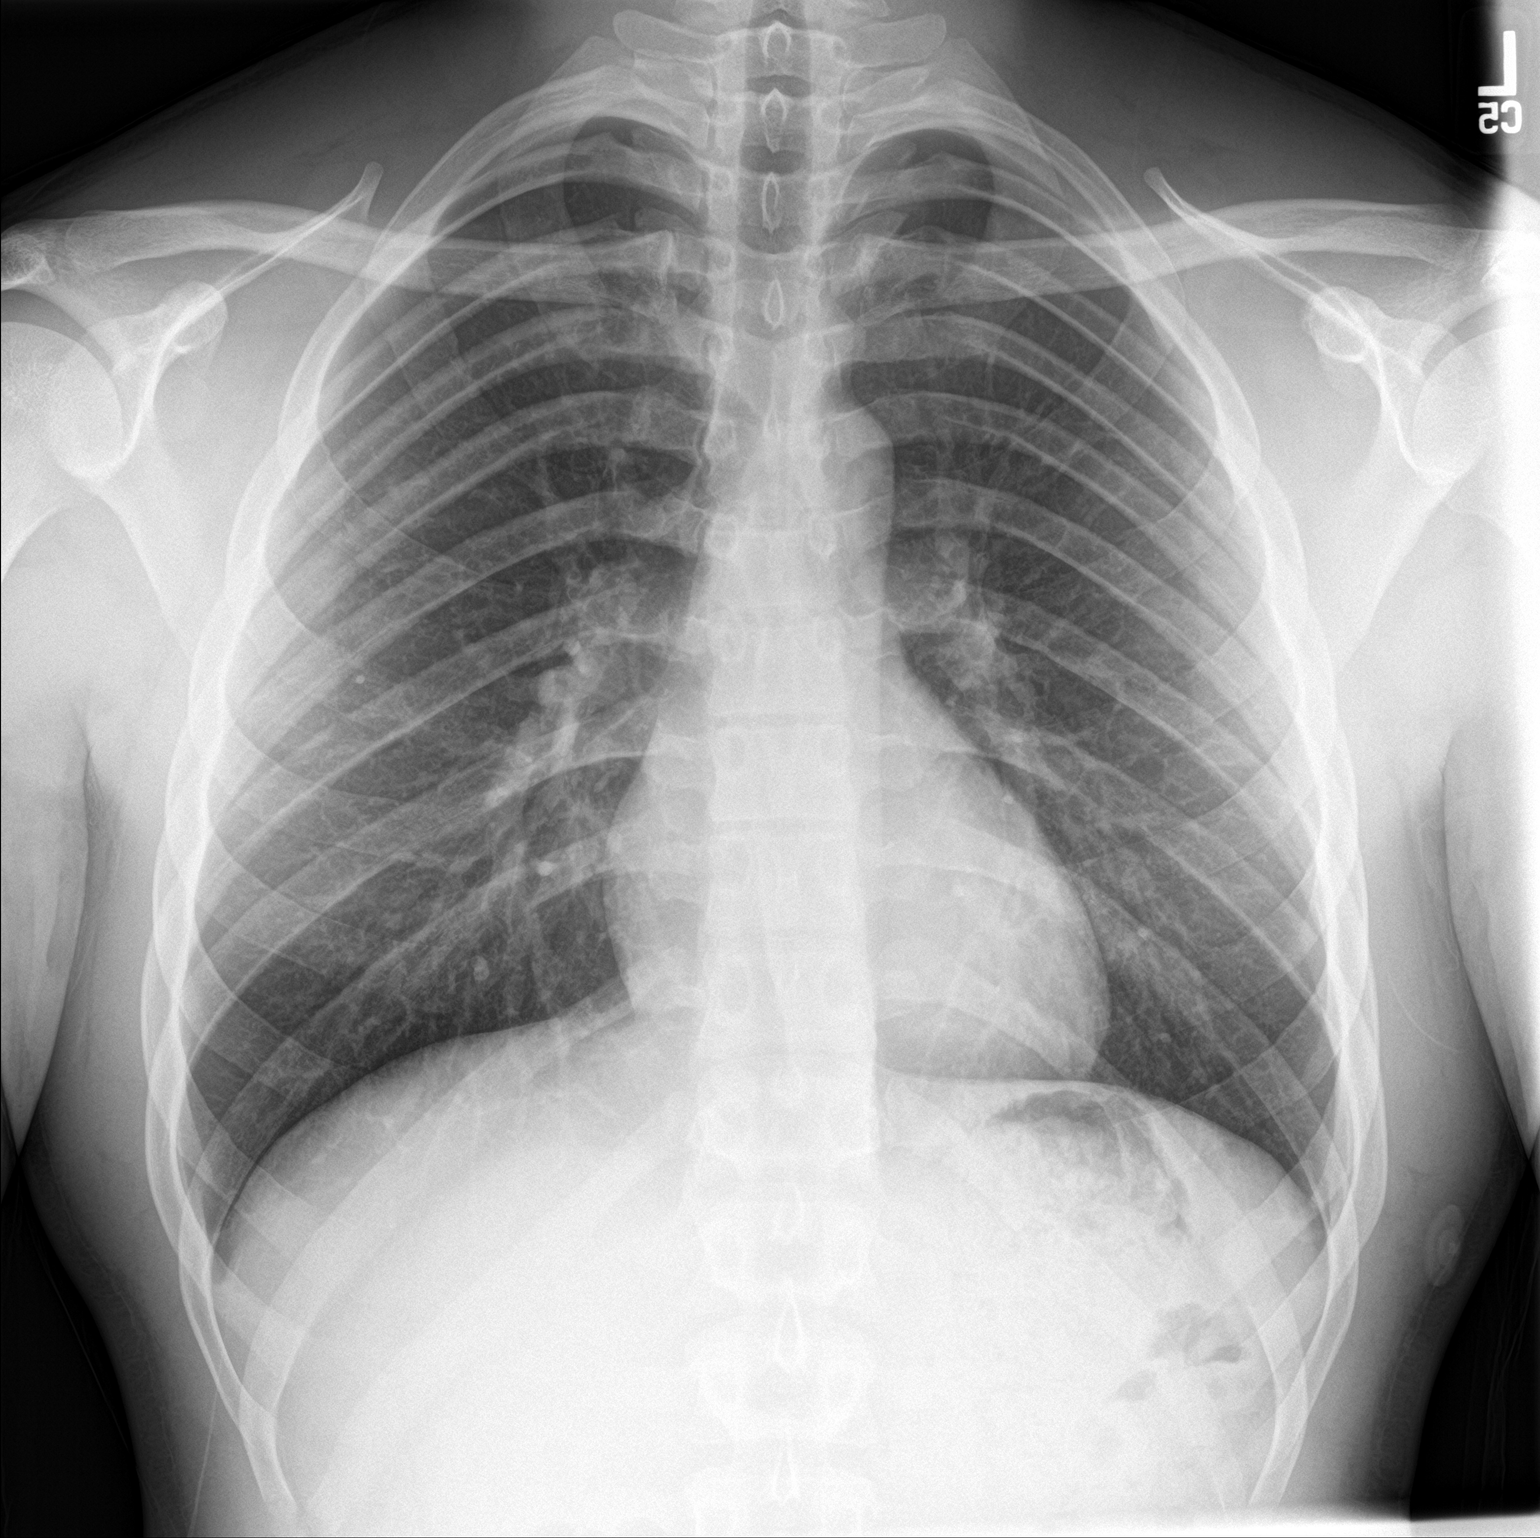

[chest lat]
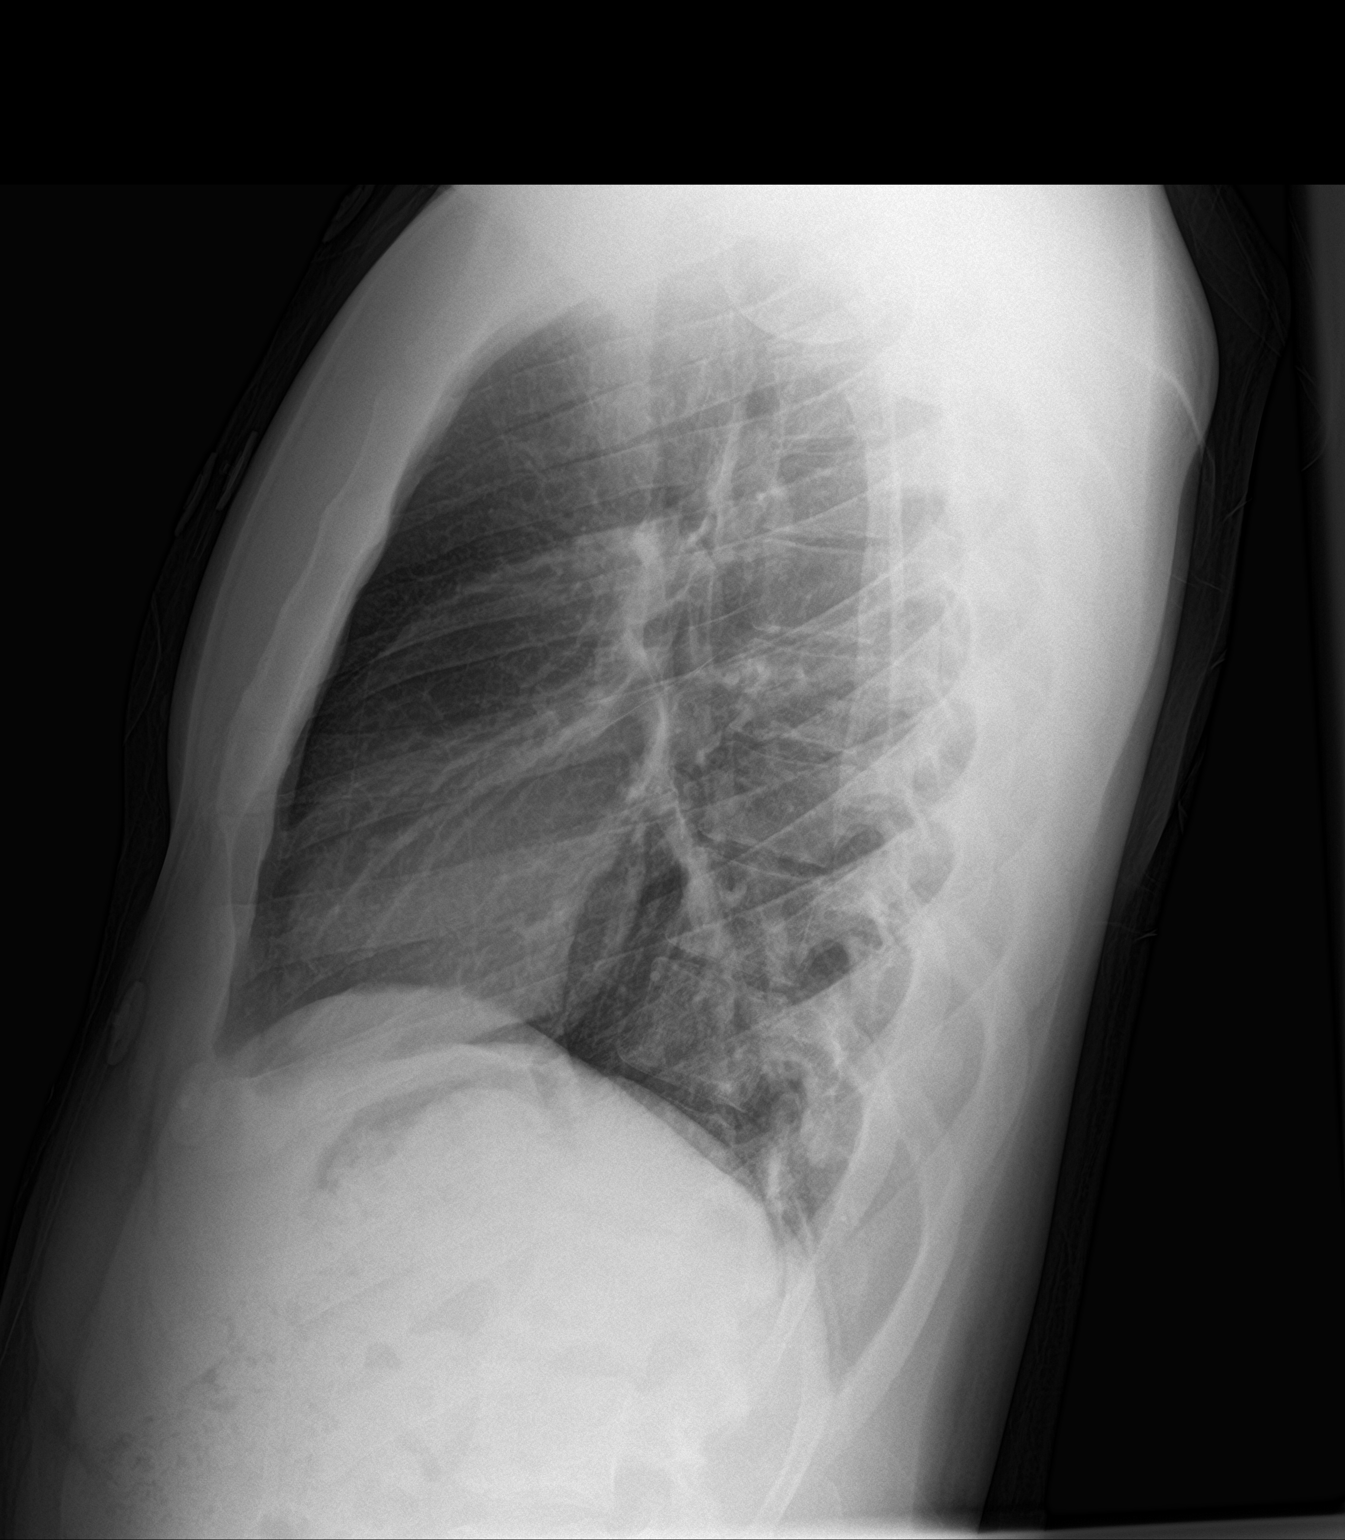

[2 of 2 positions shown; findings below may reference images not displayed]

FINDINGS: The heart size and mediastinal contours are within normal limits.
Both lungs are clear. The visualized skeletal structures are
unremarkable.
IMPRESSION: Negative.  No active cardiopulmonary disease.
